# Patient Record
Sex: Female | Born: 1945
Health system: Southern US, Community
[De-identification: ages and names within clinical notes are randomized; demographics above are authoritative.]

## PROBLEM LIST (undated history)

## (undated) DIAGNOSIS — N189 Chronic kidney disease, unspecified: Secondary | ICD-10-CM

## (undated) DIAGNOSIS — J45909 Unspecified asthma, uncomplicated: Secondary | ICD-10-CM

## (undated) DIAGNOSIS — I10 Essential (primary) hypertension: Secondary | ICD-10-CM

## (undated) DIAGNOSIS — M1990 Unspecified osteoarthritis, unspecified site: Secondary | ICD-10-CM

## (undated) DIAGNOSIS — M3500 Sicca syndrome, unspecified: Secondary | ICD-10-CM

## (undated) DIAGNOSIS — R112 Nausea with vomiting, unspecified: Secondary | ICD-10-CM

## (undated) DIAGNOSIS — Z9889 Other specified postprocedural states: Secondary | ICD-10-CM

## (undated) HISTORY — PX: TUBAL LIGATION: SHX77

## (undated) HISTORY — PX: CATARACT EXTRACTION W/ INTRAOCULAR LENS IMPLANT: SHX1309

## (undated) HISTORY — PX: OTHER SURGICAL HISTORY: SHX169

## (undated) HISTORY — PX: COLONOSCOPY: SHX174

## (undated) HISTORY — PX: BREAST BIOPSY: SHX20

---

## 1981-05-27 HISTORY — PX: TUBAL LIGATION: SHX77

## 2001-05-27 HISTORY — PX: BREAST BIOPSY: SHX20

## 2007-02-27 ENCOUNTER — Encounter (HOSPITAL_COMMUNITY): Admission: RE | Admit: 2007-02-27 | Discharge: 2007-03-29 | Payer: Self-pay | Admitting: Dentistry

## 2009-01-14 IMAGING — NM NM BONE 3 PHASE
2 series · 12 of 12 positions shown · non-contrast
Comparison: None.

CLINICAL DATA: Right maxillofacial condylar resorption.

NM 3-PHASE BONE SCAN  02/27/2007:
TECHNIQUE: Radionuclide angiographic, immediate static, and 3-hour delayed
images of the head and neck were obtained after intravenous injection of
radiopharmaceutical.
Radiopharmaceutical:  25 mCi 5c-FFm MDP

[Series 1: flow and static · 9.33mm/px · 6 of 40 frames shown (1 of 2)]
[frame 4/40]
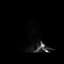
[frame 10/40]
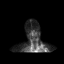
[frame 17/40]
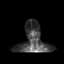
[frame 24/40]
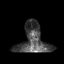
[frame 30/40]
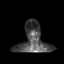
[frame 37/40]
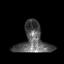

[Series 1: flow and static · 9.33mm/px · 6 of 40 frames shown (2 of 2)]
[frame 4/40]
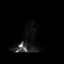
[frame 10/40]
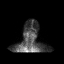
[frame 17/40]
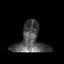
[frame 24/40]
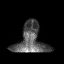
[frame 30/40]
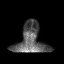
[frame 37/40]
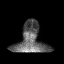

[12 of 12 positions shown; findings below may reference images not displayed]

FINDINGS: Anterior-posterior flow images demonstrate normal and symmetric flow
to both sides of the head and neck. No abnormal activity on the early static
blood pool images. Delayed static images demonstrating no abnormal uptake in the
region of the occipital or mandibular condyles. Mild degenerative uptake noted
in the cervical spine.
IMPRESSION: Mild degenerative activity in the cervical spine. Normal examination otherwise.
Specifically, no abnormal activity in the skull or face.

## 2016-06-14 DIAGNOSIS — E7800 Pure hypercholesterolemia, unspecified: Secondary | ICD-10-CM | POA: Diagnosis not present

## 2016-06-14 DIAGNOSIS — J4522 Mild intermittent asthma with status asthmaticus: Secondary | ICD-10-CM | POA: Diagnosis not present

## 2016-06-14 DIAGNOSIS — M1990 Unspecified osteoarthritis, unspecified site: Secondary | ICD-10-CM | POA: Diagnosis not present

## 2016-06-19 DIAGNOSIS — Z6825 Body mass index (BMI) 25.0-25.9, adult: Secondary | ICD-10-CM | POA: Diagnosis not present

## 2016-06-19 DIAGNOSIS — M1990 Unspecified osteoarthritis, unspecified site: Secondary | ICD-10-CM | POA: Diagnosis not present

## 2016-06-19 DIAGNOSIS — J4522 Mild intermittent asthma with status asthmaticus: Secondary | ICD-10-CM | POA: Diagnosis not present

## 2016-06-19 DIAGNOSIS — Z1389 Encounter for screening for other disorder: Secondary | ICD-10-CM | POA: Diagnosis not present

## 2016-06-19 DIAGNOSIS — E7800 Pure hypercholesterolemia, unspecified: Secondary | ICD-10-CM | POA: Diagnosis not present

## 2016-06-19 DIAGNOSIS — M7581 Other shoulder lesions, right shoulder: Secondary | ICD-10-CM | POA: Diagnosis not present

## 2016-06-19 DIAGNOSIS — Z0000 Encounter for general adult medical examination without abnormal findings: Secondary | ICD-10-CM | POA: Diagnosis not present

## 2016-07-10 DIAGNOSIS — Z1211 Encounter for screening for malignant neoplasm of colon: Secondary | ICD-10-CM | POA: Diagnosis not present

## 2016-07-10 DIAGNOSIS — Z1212 Encounter for screening for malignant neoplasm of rectum: Secondary | ICD-10-CM | POA: Diagnosis not present

## 2017-01-15 DIAGNOSIS — H04123 Dry eye syndrome of bilateral lacrimal glands: Secondary | ICD-10-CM | POA: Diagnosis not present

## 2017-01-15 DIAGNOSIS — H25813 Combined forms of age-related cataract, bilateral: Secondary | ICD-10-CM | POA: Diagnosis not present

## 2017-05-27 HISTORY — PX: CATARACT EXTRACTION W/ INTRAOCULAR LENS IMPLANT: SHX1309

## 2017-06-19 DIAGNOSIS — E7800 Pure hypercholesterolemia, unspecified: Secondary | ICD-10-CM | POA: Diagnosis not present

## 2017-06-19 DIAGNOSIS — E559 Vitamin D deficiency, unspecified: Secondary | ICD-10-CM | POA: Diagnosis not present

## 2017-06-19 DIAGNOSIS — Z6822 Body mass index (BMI) 22.0-22.9, adult: Secondary | ICD-10-CM | POA: Diagnosis not present

## 2017-06-19 DIAGNOSIS — Z0000 Encounter for general adult medical examination without abnormal findings: Secondary | ICD-10-CM | POA: Diagnosis not present

## 2017-06-19 DIAGNOSIS — D519 Vitamin B12 deficiency anemia, unspecified: Secondary | ICD-10-CM | POA: Diagnosis not present

## 2017-06-23 DIAGNOSIS — M1990 Unspecified osteoarthritis, unspecified site: Secondary | ICD-10-CM | POA: Diagnosis not present

## 2017-06-23 DIAGNOSIS — Z0000 Encounter for general adult medical examination without abnormal findings: Secondary | ICD-10-CM | POA: Diagnosis not present

## 2017-06-23 DIAGNOSIS — Z6822 Body mass index (BMI) 22.0-22.9, adult: Secondary | ICD-10-CM | POA: Diagnosis not present

## 2017-07-14 DIAGNOSIS — Z79899 Other long term (current) drug therapy: Secondary | ICD-10-CM | POA: Diagnosis not present

## 2017-07-14 DIAGNOSIS — M85852 Other specified disorders of bone density and structure, left thigh: Secondary | ICD-10-CM | POA: Diagnosis not present

## 2017-07-14 DIAGNOSIS — J45909 Unspecified asthma, uncomplicated: Secondary | ICD-10-CM | POA: Diagnosis not present

## 2017-07-14 DIAGNOSIS — M810 Age-related osteoporosis without current pathological fracture: Secondary | ICD-10-CM | POA: Diagnosis not present

## 2017-07-14 DIAGNOSIS — M85851 Other specified disorders of bone density and structure, right thigh: Secondary | ICD-10-CM | POA: Diagnosis not present

## 2017-07-14 DIAGNOSIS — Z8262 Family history of osteoporosis: Secondary | ICD-10-CM | POA: Diagnosis not present

## 2017-07-14 DIAGNOSIS — Z780 Asymptomatic menopausal state: Secondary | ICD-10-CM | POA: Diagnosis not present

## 2017-08-20 DIAGNOSIS — Z01818 Encounter for other preprocedural examination: Secondary | ICD-10-CM | POA: Diagnosis not present

## 2017-08-20 DIAGNOSIS — H35372 Puckering of macula, left eye: Secondary | ICD-10-CM | POA: Diagnosis not present

## 2017-08-20 DIAGNOSIS — H25811 Combined forms of age-related cataract, right eye: Secondary | ICD-10-CM | POA: Diagnosis not present

## 2017-09-04 DIAGNOSIS — H25811 Combined forms of age-related cataract, right eye: Secondary | ICD-10-CM | POA: Diagnosis not present

## 2017-09-04 DIAGNOSIS — H2511 Age-related nuclear cataract, right eye: Secondary | ICD-10-CM | POA: Diagnosis not present

## 2017-09-18 DIAGNOSIS — H25812 Combined forms of age-related cataract, left eye: Secondary | ICD-10-CM | POA: Diagnosis not present

## 2017-09-18 DIAGNOSIS — H2512 Age-related nuclear cataract, left eye: Secondary | ICD-10-CM | POA: Diagnosis not present

## 2017-10-15 DIAGNOSIS — H04123 Dry eye syndrome of bilateral lacrimal glands: Secondary | ICD-10-CM | POA: Diagnosis not present

## 2017-12-09 DIAGNOSIS — R21 Rash and other nonspecific skin eruption: Secondary | ICD-10-CM | POA: Diagnosis not present

## 2017-12-09 DIAGNOSIS — Z6822 Body mass index (BMI) 22.0-22.9, adult: Secondary | ICD-10-CM | POA: Diagnosis not present

## 2017-12-10 DIAGNOSIS — L570 Actinic keratosis: Secondary | ICD-10-CM | POA: Diagnosis not present

## 2017-12-10 DIAGNOSIS — L309 Dermatitis, unspecified: Secondary | ICD-10-CM | POA: Diagnosis not present

## 2017-12-10 DIAGNOSIS — D485 Neoplasm of uncertain behavior of skin: Secondary | ICD-10-CM | POA: Diagnosis not present

## 2018-02-24 DIAGNOSIS — L280 Lichen simplex chronicus: Secondary | ICD-10-CM | POA: Diagnosis not present

## 2018-02-24 DIAGNOSIS — L570 Actinic keratosis: Secondary | ICD-10-CM | POA: Diagnosis not present

## 2018-06-24 DIAGNOSIS — E559 Vitamin D deficiency, unspecified: Secondary | ICD-10-CM | POA: Diagnosis not present

## 2018-06-24 DIAGNOSIS — Z13220 Encounter for screening for lipoid disorders: Secondary | ICD-10-CM | POA: Diagnosis not present

## 2018-06-24 DIAGNOSIS — Z6822 Body mass index (BMI) 22.0-22.9, adult: Secondary | ICD-10-CM | POA: Diagnosis not present

## 2018-06-24 DIAGNOSIS — Z0000 Encounter for general adult medical examination without abnormal findings: Secondary | ICD-10-CM | POA: Diagnosis not present

## 2018-06-24 DIAGNOSIS — D519 Vitamin B12 deficiency anemia, unspecified: Secondary | ICD-10-CM | POA: Diagnosis not present

## 2018-07-01 DIAGNOSIS — M1990 Unspecified osteoarthritis, unspecified site: Secondary | ICD-10-CM | POA: Diagnosis not present

## 2018-07-01 DIAGNOSIS — Z6822 Body mass index (BMI) 22.0-22.9, adult: Secondary | ICD-10-CM | POA: Diagnosis not present

## 2018-07-01 DIAGNOSIS — J340 Abscess, furuncle and carbuncle of nose: Secondary | ICD-10-CM | POA: Diagnosis not present

## 2018-07-01 DIAGNOSIS — Z0000 Encounter for general adult medical examination without abnormal findings: Secondary | ICD-10-CM | POA: Diagnosis not present

## 2018-07-01 DIAGNOSIS — J4522 Mild intermittent asthma with status asthmaticus: Secondary | ICD-10-CM | POA: Diagnosis not present

## 2018-08-12 DIAGNOSIS — M25541 Pain in joints of right hand: Secondary | ICD-10-CM | POA: Diagnosis not present

## 2018-08-12 DIAGNOSIS — Z6821 Body mass index (BMI) 21.0-21.9, adult: Secondary | ICD-10-CM | POA: Diagnosis not present

## 2018-08-12 DIAGNOSIS — M2550 Pain in unspecified joint: Secondary | ICD-10-CM | POA: Diagnosis not present

## 2018-08-12 DIAGNOSIS — R5383 Other fatigue: Secondary | ICD-10-CM | POA: Diagnosis not present

## 2018-08-12 DIAGNOSIS — M25542 Pain in joints of left hand: Secondary | ICD-10-CM | POA: Diagnosis not present

## 2018-08-12 DIAGNOSIS — M25571 Pain in right ankle and joints of right foot: Secondary | ICD-10-CM | POA: Diagnosis not present

## 2018-08-12 DIAGNOSIS — Z7689 Persons encountering health services in other specified circumstances: Secondary | ICD-10-CM | POA: Diagnosis not present

## 2018-08-12 DIAGNOSIS — M25572 Pain in left ankle and joints of left foot: Secondary | ICD-10-CM | POA: Diagnosis not present

## 2018-08-20 DIAGNOSIS — M7989 Other specified soft tissue disorders: Secondary | ICD-10-CM | POA: Diagnosis not present

## 2018-08-20 DIAGNOSIS — R5383 Other fatigue: Secondary | ICD-10-CM | POA: Diagnosis not present

## 2018-08-20 DIAGNOSIS — R768 Other specified abnormal immunological findings in serum: Secondary | ICD-10-CM | POA: Diagnosis not present

## 2018-08-20 DIAGNOSIS — Z6822 Body mass index (BMI) 22.0-22.9, adult: Secondary | ICD-10-CM | POA: Diagnosis not present

## 2018-08-20 DIAGNOSIS — M0589 Other rheumatoid arthritis with rheumatoid factor of multiple sites: Secondary | ICD-10-CM | POA: Diagnosis not present

## 2018-08-20 DIAGNOSIS — M150 Primary generalized (osteo)arthritis: Secondary | ICD-10-CM | POA: Diagnosis not present

## 2018-09-02 DIAGNOSIS — I10 Essential (primary) hypertension: Secondary | ICD-10-CM | POA: Diagnosis not present

## 2018-09-02 DIAGNOSIS — I25118 Atherosclerotic heart disease of native coronary artery with other forms of angina pectoris: Secondary | ICD-10-CM | POA: Diagnosis not present

## 2018-09-02 DIAGNOSIS — L309 Dermatitis, unspecified: Secondary | ICD-10-CM | POA: Diagnosis not present

## 2018-09-02 DIAGNOSIS — R0602 Shortness of breath: Secondary | ICD-10-CM | POA: Diagnosis not present

## 2018-09-02 DIAGNOSIS — I739 Peripheral vascular disease, unspecified: Secondary | ICD-10-CM | POA: Diagnosis not present

## 2018-09-02 DIAGNOSIS — L570 Actinic keratosis: Secondary | ICD-10-CM | POA: Diagnosis not present

## 2018-09-02 DIAGNOSIS — H2513 Age-related nuclear cataract, bilateral: Secondary | ICD-10-CM | POA: Diagnosis not present

## 2018-09-02 DIAGNOSIS — H04123 Dry eye syndrome of bilateral lacrimal glands: Secondary | ICD-10-CM | POA: Diagnosis not present

## 2018-09-02 DIAGNOSIS — H401131 Primary open-angle glaucoma, bilateral, mild stage: Secondary | ICD-10-CM | POA: Diagnosis not present

## 2018-09-02 DIAGNOSIS — E785 Hyperlipidemia, unspecified: Secondary | ICD-10-CM | POA: Diagnosis not present

## 2018-10-21 DIAGNOSIS — R768 Other specified abnormal immunological findings in serum: Secondary | ICD-10-CM | POA: Diagnosis not present

## 2018-10-21 DIAGNOSIS — R5383 Other fatigue: Secondary | ICD-10-CM | POA: Diagnosis not present

## 2018-10-21 DIAGNOSIS — M7989 Other specified soft tissue disorders: Secondary | ICD-10-CM | POA: Diagnosis not present

## 2018-10-21 DIAGNOSIS — M791 Myalgia, unspecified site: Secondary | ICD-10-CM | POA: Diagnosis not present

## 2018-10-21 DIAGNOSIS — M150 Primary generalized (osteo)arthritis: Secondary | ICD-10-CM | POA: Diagnosis not present

## 2018-10-21 DIAGNOSIS — M3500 Sicca syndrome, unspecified: Secondary | ICD-10-CM | POA: Diagnosis not present

## 2018-10-21 DIAGNOSIS — M0589 Other rheumatoid arthritis with rheumatoid factor of multiple sites: Secondary | ICD-10-CM | POA: Diagnosis not present

## 2018-10-21 DIAGNOSIS — Z6820 Body mass index (BMI) 20.0-20.9, adult: Secondary | ICD-10-CM | POA: Diagnosis not present

## 2018-11-05 ENCOUNTER — Ambulatory Visit (INDEPENDENT_AMBULATORY_CARE_PROVIDER_SITE_OTHER): Payer: PPO | Admitting: Diagnostic Neuroimaging

## 2018-11-05 ENCOUNTER — Other Ambulatory Visit: Payer: Self-pay

## 2018-11-05 ENCOUNTER — Encounter (INDEPENDENT_AMBULATORY_CARE_PROVIDER_SITE_OTHER): Payer: PPO | Admitting: Diagnostic Neuroimaging

## 2018-11-05 DIAGNOSIS — R29898 Other symptoms and signs involving the musculoskeletal system: Secondary | ICD-10-CM

## 2018-11-05 DIAGNOSIS — Z0289 Encounter for other administrative examinations: Secondary | ICD-10-CM

## 2018-11-05 NOTE — Procedures (Signed)
[facility]  NCS (NERVE CONDUCTION STUDY) WITH EMG (ELECTROMYOGRAPHY) REPORT   STUDY DATE: 11/05/18 PATIENT NAME: Tammy Sutton DOB: [birth year] MRN: [id number]  ORDERING CLINICIAN: Marella Chimes, PA  TECHNOLOGIST: Sherre Scarlet ELECTROMYOGRAPHER: Earlean Polka. Areyana Leoni, MD  CLINICAL INFORMATION: 73 year old female with lower extremity weakness.  History of Sjogren's syndrome.  FINDINGS: NERVE CONDUCTION STUDY: Right ulnar, left peroneal and left tibial motor responses are normal.  Right peroneal and right tibial motor responses have normal distal latencies, decreased amplitudes and normal conduction velocities.  Bilateral sural and left superficial peroneal sensory responses have normal peak latencies and decreased amplitudes.  Right superficial peroneal and right ulnar sensory responses normal.  Right tibial F wave latency is slightly prolonged.  Left tibial and right ulnar F-wave latencies are normal.   NEEDLE ELECTROMYOGRAPHY:  Needle examination of right lower extremity vastus medialis, tibialis anterior, gastrocnemius, gluteus medius, iliopsoas and right lumbar paraspinal muscles is normal.   IMPRESSION:  Abnormal study demonstrating: - Axonal sensorimotor polyneuropathy.  No electrodiagnostic evidence of myopathy or polyradiculopathy at this time.    INTERPRETING PHYSICIAN:  Penni Bombard, MD Certified in Neurology, Neurophysiology and Neuroimaging  [facility] [address], [place], [place] [address] [phone]   [facility]    Nerve / Sites Muscle Latency Ref. Amplitude Ref. Rel Amp Segments Distance Velocity Ref. Area    ms ms mV mV %  cm m/s m/s mVms  R Ulnar - ADM     Wrist ADM 2.8 ?3.3 6.1 ?6.0 100 Wrist - ADM 7   21.1     B.Elbow ADM 5.6  5.3  88 B.Elbow - Wrist 17 62 ?49 18.6     A.Elbow ADM 7.3  5.2  97.5 A.Elbow - B.Elbow 10 58 ?49 18.8         A.Elbow - Wrist      R Peroneal - EDB     Ankle EDB 4.6 ?6.5  1.8 ?2.0 100 Ankle - EDB 9   6.2     Fib head EDB 10.5  1.6  86.8 Fib head - Ankle 27 45 ?44 6.0     Pop fossa EDB 12.8  1.5  93.5 Pop fossa - Fib head 10 45 ?44 6.1         Pop fossa - Ankle      L Peroneal - EDB     Ankle EDB 4.5 ?6.5 3.7 ?2.0 100 Ankle - EDB 9   11.5     Fib head EDB 10.6  3.2  88.1 Fib head - Ankle 29 48 ?44 10.9     Pop fossa EDB 12.8  3.1  97.2 Pop fossa - Fib head 10 47 ?44 10.8         Pop fossa - Ankle      R Tibial - AH     Ankle AH 3.7 ?5.8 2.5 ?4.0 100 Ankle - AH 9   5.1     Pop fossa AH 12.2  1.6  63.5 Pop fossa - Ankle 35 41 ?41 3.9  L Tibial - AH     Ankle AH 3.9 ?5.8 5.9 ?4.0 100 Ankle - AH 9   11.6     Pop fossa AH 12.7  4.4  73.9 Pop fossa - Ankle 36 41 ?41 9.7               SNC    Nerve / Sites Rec. Site Peak Lat Ref.  Amp Ref. Segments  Distance    ms ms V V  cm  R Sural - Ankle (Calf)     Calf Ankle 3.3 ?4.4 4 ?6 Calf - Ankle 14  L Sural - Ankle (Calf)     Calf Ankle 3.5 ?4.4 4 ?6 Calf - Ankle 14  R Superficial peroneal - Ankle     Lat leg Ankle 3.9 ?4.4 6 ?6 Lat leg - Ankle 14  L Superficial peroneal - Ankle     Lat leg Ankle 4.1 ?4.4 2 ?6 Lat leg - Ankle 14  R Ulnar - Orthodromic, (Dig V, Mid palm)     Dig V Wrist 3.0 ?3.1 7 ?5 Dig V - Wrist 56               F  Wave    Nerve F Lat Ref.   ms ms  R Tibial - AH 56.9 ?56.0  R Ulnar - ADM 26.3 ?32.0  L Tibial - AH 55.5 ?56.0           EMG full       EMG Summary Table    Spontaneous MUAP Recruitment  Muscle IA Fib PSW Fasc Other Amp Dur. Poly Pattern  R. Vastus medialis Normal None None None _______ Normal Normal Normal Normal  R. Tibialis anterior Normal None None None _______ Normal Normal Normal Normal  R. Gastrocnemius (Medial head) Normal None None None _______ Normal Normal Normal Normal  R. Iliopsoas Normal None None None _______ Normal Normal Normal Normal  R. Gluteus medius Normal None None None _______ Normal Normal Normal Normal  R. Lumbar paraspinals Normal None None None  _______ Normal Normal Normal Normal

## 2018-12-10 DIAGNOSIS — Z681 Body mass index (BMI) 19 or less, adult: Secondary | ICD-10-CM | POA: Diagnosis not present

## 2018-12-10 DIAGNOSIS — R768 Other specified abnormal immunological findings in serum: Secondary | ICD-10-CM | POA: Diagnosis not present

## 2018-12-10 DIAGNOSIS — M7989 Other specified soft tissue disorders: Secondary | ICD-10-CM | POA: Diagnosis not present

## 2018-12-10 DIAGNOSIS — M791 Myalgia, unspecified site: Secondary | ICD-10-CM | POA: Diagnosis not present

## 2018-12-10 DIAGNOSIS — M0589 Other rheumatoid arthritis with rheumatoid factor of multiple sites: Secondary | ICD-10-CM | POA: Diagnosis not present

## 2018-12-10 DIAGNOSIS — R5383 Other fatigue: Secondary | ICD-10-CM | POA: Diagnosis not present

## 2018-12-10 DIAGNOSIS — M150 Primary generalized (osteo)arthritis: Secondary | ICD-10-CM | POA: Diagnosis not present

## 2018-12-10 DIAGNOSIS — M3500 Sicca syndrome, unspecified: Secondary | ICD-10-CM | POA: Diagnosis not present

## 2018-12-29 DIAGNOSIS — M0589 Other rheumatoid arthritis with rheumatoid factor of multiple sites: Secondary | ICD-10-CM | POA: Diagnosis not present

## 2019-01-27 DIAGNOSIS — M0589 Other rheumatoid arthritis with rheumatoid factor of multiple sites: Secondary | ICD-10-CM | POA: Diagnosis not present

## 2019-01-27 DIAGNOSIS — H04123 Dry eye syndrome of bilateral lacrimal glands: Secondary | ICD-10-CM | POA: Diagnosis not present

## 2019-03-18 DIAGNOSIS — R5383 Other fatigue: Secondary | ICD-10-CM | POA: Diagnosis not present

## 2019-03-18 DIAGNOSIS — M7989 Other specified soft tissue disorders: Secondary | ICD-10-CM | POA: Diagnosis not present

## 2019-03-18 DIAGNOSIS — M0589 Other rheumatoid arthritis with rheumatoid factor of multiple sites: Secondary | ICD-10-CM | POA: Diagnosis not present

## 2019-03-18 DIAGNOSIS — Z681 Body mass index (BMI) 19 or less, adult: Secondary | ICD-10-CM | POA: Diagnosis not present

## 2019-03-18 DIAGNOSIS — R768 Other specified abnormal immunological findings in serum: Secondary | ICD-10-CM | POA: Diagnosis not present

## 2019-03-18 DIAGNOSIS — M150 Primary generalized (osteo)arthritis: Secondary | ICD-10-CM | POA: Diagnosis not present

## 2019-03-18 DIAGNOSIS — G608 Other hereditary and idiopathic neuropathies: Secondary | ICD-10-CM | POA: Diagnosis not present

## 2019-03-18 DIAGNOSIS — M3500 Sicca syndrome, unspecified: Secondary | ICD-10-CM | POA: Diagnosis not present

## 2019-03-18 DIAGNOSIS — M791 Myalgia, unspecified site: Secondary | ICD-10-CM | POA: Diagnosis not present

## 2019-03-26 DIAGNOSIS — M0589 Other rheumatoid arthritis with rheumatoid factor of multiple sites: Secondary | ICD-10-CM | POA: Diagnosis not present

## 2019-05-03 DIAGNOSIS — Z1231 Encounter for screening mammogram for malignant neoplasm of breast: Secondary | ICD-10-CM | POA: Diagnosis not present

## 2019-06-16 DIAGNOSIS — R768 Other specified abnormal immunological findings in serum: Secondary | ICD-10-CM | POA: Diagnosis not present

## 2019-06-16 DIAGNOSIS — R5383 Other fatigue: Secondary | ICD-10-CM | POA: Diagnosis not present

## 2019-06-16 DIAGNOSIS — M0589 Other rheumatoid arthritis with rheumatoid factor of multiple sites: Secondary | ICD-10-CM | POA: Diagnosis not present

## 2019-07-02 DIAGNOSIS — E559 Vitamin D deficiency, unspecified: Secondary | ICD-10-CM | POA: Diagnosis not present

## 2019-07-02 DIAGNOSIS — D519 Vitamin B12 deficiency anemia, unspecified: Secondary | ICD-10-CM | POA: Diagnosis not present

## 2019-07-02 DIAGNOSIS — Z13220 Encounter for screening for lipoid disorders: Secondary | ICD-10-CM | POA: Diagnosis not present

## 2019-07-06 DIAGNOSIS — M1990 Unspecified osteoarthritis, unspecified site: Secondary | ICD-10-CM | POA: Diagnosis not present

## 2019-07-06 DIAGNOSIS — J4522 Mild intermittent asthma with status asthmaticus: Secondary | ICD-10-CM | POA: Diagnosis not present

## 2019-07-06 DIAGNOSIS — Z681 Body mass index (BMI) 19 or less, adult: Secondary | ICD-10-CM | POA: Diagnosis not present

## 2019-07-06 DIAGNOSIS — Z0000 Encounter for general adult medical examination without abnormal findings: Secondary | ICD-10-CM | POA: Diagnosis not present

## 2019-07-14 DIAGNOSIS — Z8249 Family history of ischemic heart disease and other diseases of the circulatory system: Secondary | ICD-10-CM | POA: Diagnosis not present

## 2019-07-14 DIAGNOSIS — Z681 Body mass index (BMI) 19 or less, adult: Secondary | ICD-10-CM | POA: Diagnosis not present

## 2019-07-14 DIAGNOSIS — Z0000 Encounter for general adult medical examination without abnormal findings: Secondary | ICD-10-CM | POA: Diagnosis not present

## 2019-08-04 DIAGNOSIS — M150 Primary generalized (osteo)arthritis: Secondary | ICD-10-CM | POA: Diagnosis not present

## 2019-08-04 DIAGNOSIS — R5383 Other fatigue: Secondary | ICD-10-CM | POA: Diagnosis not present

## 2019-08-04 DIAGNOSIS — R768 Other specified abnormal immunological findings in serum: Secondary | ICD-10-CM | POA: Diagnosis not present

## 2019-08-04 DIAGNOSIS — M791 Myalgia, unspecified site: Secondary | ICD-10-CM | POA: Diagnosis not present

## 2019-08-04 DIAGNOSIS — M3500 Sicca syndrome, unspecified: Secondary | ICD-10-CM | POA: Diagnosis not present

## 2019-08-04 DIAGNOSIS — M7989 Other specified soft tissue disorders: Secondary | ICD-10-CM | POA: Diagnosis not present

## 2019-08-04 DIAGNOSIS — G608 Other hereditary and idiopathic neuropathies: Secondary | ICD-10-CM | POA: Diagnosis not present

## 2019-08-04 DIAGNOSIS — M0589 Other rheumatoid arthritis with rheumatoid factor of multiple sites: Secondary | ICD-10-CM | POA: Diagnosis not present

## 2019-09-01 DIAGNOSIS — I781 Nevus, non-neoplastic: Secondary | ICD-10-CM | POA: Diagnosis not present

## 2019-09-01 DIAGNOSIS — L821 Other seborrheic keratosis: Secondary | ICD-10-CM | POA: Diagnosis not present

## 2019-09-01 DIAGNOSIS — L570 Actinic keratosis: Secondary | ICD-10-CM | POA: Diagnosis not present

## 2019-09-01 DIAGNOSIS — D1801 Hemangioma of skin and subcutaneous tissue: Secondary | ICD-10-CM | POA: Diagnosis not present

## 2019-09-27 DIAGNOSIS — S86819A Strain of other muscle(s) and tendon(s) at lower leg level, unspecified leg, initial encounter: Secondary | ICD-10-CM | POA: Diagnosis not present

## 2019-09-30 DIAGNOSIS — Z0389 Encounter for observation for other suspected diseases and conditions ruled out: Secondary | ICD-10-CM | POA: Diagnosis not present

## 2019-09-30 DIAGNOSIS — M810 Age-related osteoporosis without current pathological fracture: Secondary | ICD-10-CM | POA: Diagnosis not present

## 2019-10-04 DIAGNOSIS — S86819A Strain of other muscle(s) and tendon(s) at lower leg level, unspecified leg, initial encounter: Secondary | ICD-10-CM | POA: Diagnosis not present

## 2019-10-04 DIAGNOSIS — M1711 Unilateral primary osteoarthritis, right knee: Secondary | ICD-10-CM | POA: Diagnosis not present

## 2019-10-04 DIAGNOSIS — M7120 Synovial cyst of popliteal space [Baker], unspecified knee: Secondary | ICD-10-CM | POA: Diagnosis not present

## 2019-12-18 DIAGNOSIS — R238 Other skin changes: Secondary | ICD-10-CM | POA: Diagnosis not present

## 2019-12-18 DIAGNOSIS — L709 Acne, unspecified: Secondary | ICD-10-CM | POA: Diagnosis not present

## 2019-12-18 DIAGNOSIS — L03211 Cellulitis of face: Secondary | ICD-10-CM | POA: Diagnosis not present

## 2020-01-11 DIAGNOSIS — M9901 Segmental and somatic dysfunction of cervical region: Secondary | ICD-10-CM | POA: Diagnosis not present

## 2020-01-11 DIAGNOSIS — S335XXA Sprain of ligaments of lumbar spine, initial encounter: Secondary | ICD-10-CM | POA: Diagnosis not present

## 2020-01-11 DIAGNOSIS — M9903 Segmental and somatic dysfunction of lumbar region: Secondary | ICD-10-CM | POA: Diagnosis not present

## 2020-01-13 DIAGNOSIS — M9903 Segmental and somatic dysfunction of lumbar region: Secondary | ICD-10-CM | POA: Diagnosis not present

## 2020-01-13 DIAGNOSIS — M9901 Segmental and somatic dysfunction of cervical region: Secondary | ICD-10-CM | POA: Diagnosis not present

## 2020-01-13 DIAGNOSIS — S335XXA Sprain of ligaments of lumbar spine, initial encounter: Secondary | ICD-10-CM | POA: Diagnosis not present

## 2020-01-18 DIAGNOSIS — M9901 Segmental and somatic dysfunction of cervical region: Secondary | ICD-10-CM | POA: Diagnosis not present

## 2020-01-18 DIAGNOSIS — S335XXA Sprain of ligaments of lumbar spine, initial encounter: Secondary | ICD-10-CM | POA: Diagnosis not present

## 2020-01-18 DIAGNOSIS — M9903 Segmental and somatic dysfunction of lumbar region: Secondary | ICD-10-CM | POA: Diagnosis not present

## 2020-01-24 DIAGNOSIS — M9903 Segmental and somatic dysfunction of lumbar region: Secondary | ICD-10-CM | POA: Diagnosis not present

## 2020-01-24 DIAGNOSIS — M9901 Segmental and somatic dysfunction of cervical region: Secondary | ICD-10-CM | POA: Diagnosis not present

## 2020-01-24 DIAGNOSIS — S335XXA Sprain of ligaments of lumbar spine, initial encounter: Secondary | ICD-10-CM | POA: Diagnosis not present

## 2020-02-02 DIAGNOSIS — H04123 Dry eye syndrome of bilateral lacrimal glands: Secondary | ICD-10-CM | POA: Diagnosis not present

## 2020-02-03 DIAGNOSIS — M9903 Segmental and somatic dysfunction of lumbar region: Secondary | ICD-10-CM | POA: Diagnosis not present

## 2020-02-03 DIAGNOSIS — S335XXA Sprain of ligaments of lumbar spine, initial encounter: Secondary | ICD-10-CM | POA: Diagnosis not present

## 2020-02-03 DIAGNOSIS — M9901 Segmental and somatic dysfunction of cervical region: Secondary | ICD-10-CM | POA: Diagnosis not present

## 2020-02-10 DIAGNOSIS — M9903 Segmental and somatic dysfunction of lumbar region: Secondary | ICD-10-CM | POA: Diagnosis not present

## 2020-02-10 DIAGNOSIS — S335XXA Sprain of ligaments of lumbar spine, initial encounter: Secondary | ICD-10-CM | POA: Diagnosis not present

## 2020-02-10 DIAGNOSIS — M9901 Segmental and somatic dysfunction of cervical region: Secondary | ICD-10-CM | POA: Diagnosis not present

## 2020-02-16 DIAGNOSIS — M0589 Other rheumatoid arthritis with rheumatoid factor of multiple sites: Secondary | ICD-10-CM | POA: Diagnosis not present

## 2020-02-16 DIAGNOSIS — R748 Abnormal levels of other serum enzymes: Secondary | ICD-10-CM | POA: Diagnosis not present

## 2020-02-16 DIAGNOSIS — M150 Primary generalized (osteo)arthritis: Secondary | ICD-10-CM | POA: Diagnosis not present

## 2020-02-16 DIAGNOSIS — G608 Other hereditary and idiopathic neuropathies: Secondary | ICD-10-CM | POA: Diagnosis not present

## 2020-02-16 DIAGNOSIS — M3500 Sicca syndrome, unspecified: Secondary | ICD-10-CM | POA: Diagnosis not present

## 2020-02-16 DIAGNOSIS — Z681 Body mass index (BMI) 19 or less, adult: Secondary | ICD-10-CM | POA: Diagnosis not present

## 2020-02-17 DIAGNOSIS — S335XXA Sprain of ligaments of lumbar spine, initial encounter: Secondary | ICD-10-CM | POA: Diagnosis not present

## 2020-02-17 DIAGNOSIS — M9901 Segmental and somatic dysfunction of cervical region: Secondary | ICD-10-CM | POA: Diagnosis not present

## 2020-02-17 DIAGNOSIS — M9903 Segmental and somatic dysfunction of lumbar region: Secondary | ICD-10-CM | POA: Diagnosis not present

## 2020-03-01 DIAGNOSIS — M9903 Segmental and somatic dysfunction of lumbar region: Secondary | ICD-10-CM | POA: Diagnosis not present

## 2020-03-01 DIAGNOSIS — S335XXA Sprain of ligaments of lumbar spine, initial encounter: Secondary | ICD-10-CM | POA: Diagnosis not present

## 2020-03-01 DIAGNOSIS — M9901 Segmental and somatic dysfunction of cervical region: Secondary | ICD-10-CM | POA: Diagnosis not present

## 2020-03-15 DIAGNOSIS — S335XXA Sprain of ligaments of lumbar spine, initial encounter: Secondary | ICD-10-CM | POA: Diagnosis not present

## 2020-03-15 DIAGNOSIS — M9903 Segmental and somatic dysfunction of lumbar region: Secondary | ICD-10-CM | POA: Diagnosis not present

## 2020-03-15 DIAGNOSIS — M9901 Segmental and somatic dysfunction of cervical region: Secondary | ICD-10-CM | POA: Diagnosis not present

## 2020-03-29 DIAGNOSIS — M9901 Segmental and somatic dysfunction of cervical region: Secondary | ICD-10-CM | POA: Diagnosis not present

## 2020-03-29 DIAGNOSIS — S335XXA Sprain of ligaments of lumbar spine, initial encounter: Secondary | ICD-10-CM | POA: Diagnosis not present

## 2020-03-29 DIAGNOSIS — M9903 Segmental and somatic dysfunction of lumbar region: Secondary | ICD-10-CM | POA: Diagnosis not present

## 2020-04-12 DIAGNOSIS — M9901 Segmental and somatic dysfunction of cervical region: Secondary | ICD-10-CM | POA: Diagnosis not present

## 2020-04-12 DIAGNOSIS — M9903 Segmental and somatic dysfunction of lumbar region: Secondary | ICD-10-CM | POA: Diagnosis not present

## 2020-04-12 DIAGNOSIS — S335XXA Sprain of ligaments of lumbar spine, initial encounter: Secondary | ICD-10-CM | POA: Diagnosis not present

## 2020-04-26 DIAGNOSIS — M9901 Segmental and somatic dysfunction of cervical region: Secondary | ICD-10-CM | POA: Diagnosis not present

## 2020-04-26 DIAGNOSIS — M9903 Segmental and somatic dysfunction of lumbar region: Secondary | ICD-10-CM | POA: Diagnosis not present

## 2020-04-26 DIAGNOSIS — S335XXA Sprain of ligaments of lumbar spine, initial encounter: Secondary | ICD-10-CM | POA: Diagnosis not present

## 2020-05-10 DIAGNOSIS — S335XXA Sprain of ligaments of lumbar spine, initial encounter: Secondary | ICD-10-CM | POA: Diagnosis not present

## 2020-05-10 DIAGNOSIS — M9901 Segmental and somatic dysfunction of cervical region: Secondary | ICD-10-CM | POA: Diagnosis not present

## 2020-05-10 DIAGNOSIS — M9903 Segmental and somatic dysfunction of lumbar region: Secondary | ICD-10-CM | POA: Diagnosis not present

## 2020-05-24 DIAGNOSIS — M9903 Segmental and somatic dysfunction of lumbar region: Secondary | ICD-10-CM | POA: Diagnosis not present

## 2020-05-24 DIAGNOSIS — S335XXA Sprain of ligaments of lumbar spine, initial encounter: Secondary | ICD-10-CM | POA: Diagnosis not present

## 2020-05-24 DIAGNOSIS — M9901 Segmental and somatic dysfunction of cervical region: Secondary | ICD-10-CM | POA: Diagnosis not present

## 2020-06-07 DIAGNOSIS — S335XXA Sprain of ligaments of lumbar spine, initial encounter: Secondary | ICD-10-CM | POA: Diagnosis not present

## 2020-06-07 DIAGNOSIS — M9903 Segmental and somatic dysfunction of lumbar region: Secondary | ICD-10-CM | POA: Diagnosis not present

## 2020-06-07 DIAGNOSIS — M9901 Segmental and somatic dysfunction of cervical region: Secondary | ICD-10-CM | POA: Diagnosis not present

## 2020-06-21 DIAGNOSIS — M9901 Segmental and somatic dysfunction of cervical region: Secondary | ICD-10-CM | POA: Diagnosis not present

## 2020-06-21 DIAGNOSIS — M9903 Segmental and somatic dysfunction of lumbar region: Secondary | ICD-10-CM | POA: Diagnosis not present

## 2020-06-21 DIAGNOSIS — S335XXA Sprain of ligaments of lumbar spine, initial encounter: Secondary | ICD-10-CM | POA: Diagnosis not present

## 2020-07-05 DIAGNOSIS — M9903 Segmental and somatic dysfunction of lumbar region: Secondary | ICD-10-CM | POA: Diagnosis not present

## 2020-07-05 DIAGNOSIS — S335XXA Sprain of ligaments of lumbar spine, initial encounter: Secondary | ICD-10-CM | POA: Diagnosis not present

## 2020-07-05 DIAGNOSIS — M9901 Segmental and somatic dysfunction of cervical region: Secondary | ICD-10-CM | POA: Diagnosis not present

## 2020-07-07 DIAGNOSIS — E7800 Pure hypercholesterolemia, unspecified: Secondary | ICD-10-CM | POA: Diagnosis not present

## 2020-07-07 DIAGNOSIS — E7801 Familial hypercholesterolemia: Secondary | ICD-10-CM | POA: Diagnosis not present

## 2020-07-07 DIAGNOSIS — D519 Vitamin B12 deficiency anemia, unspecified: Secondary | ICD-10-CM | POA: Diagnosis not present

## 2020-07-07 DIAGNOSIS — E559 Vitamin D deficiency, unspecified: Secondary | ICD-10-CM | POA: Diagnosis not present

## 2020-07-07 DIAGNOSIS — Z13220 Encounter for screening for lipoid disorders: Secondary | ICD-10-CM | POA: Diagnosis not present

## 2020-07-12 DIAGNOSIS — D72819 Decreased white blood cell count, unspecified: Secondary | ICD-10-CM | POA: Diagnosis not present

## 2020-07-12 DIAGNOSIS — S46001A Unspecified injury of muscle(s) and tendon(s) of the rotator cuff of right shoulder, initial encounter: Secondary | ICD-10-CM | POA: Diagnosis not present

## 2020-07-12 DIAGNOSIS — M8580 Other specified disorders of bone density and structure, unspecified site: Secondary | ICD-10-CM | POA: Diagnosis not present

## 2020-07-12 DIAGNOSIS — Z6820 Body mass index (BMI) 20.0-20.9, adult: Secondary | ICD-10-CM | POA: Diagnosis not present

## 2020-07-12 DIAGNOSIS — R7303 Prediabetes: Secondary | ICD-10-CM | POA: Diagnosis not present

## 2020-07-18 DIAGNOSIS — M05761 Rheumatoid arthritis with rheumatoid factor of right knee without organ or systems involvement: Secondary | ICD-10-CM | POA: Diagnosis not present

## 2020-07-18 DIAGNOSIS — M1711 Unilateral primary osteoarthritis, right knee: Secondary | ICD-10-CM | POA: Diagnosis not present

## 2020-07-18 DIAGNOSIS — S46011A Strain of muscle(s) and tendon(s) of the rotator cuff of right shoulder, initial encounter: Secondary | ICD-10-CM | POA: Diagnosis not present

## 2020-07-18 DIAGNOSIS — M7061 Trochanteric bursitis, right hip: Secondary | ICD-10-CM | POA: Diagnosis not present

## 2020-07-18 DIAGNOSIS — M7121 Synovial cyst of popliteal space [Baker], right knee: Secondary | ICD-10-CM | POA: Diagnosis not present

## 2020-07-19 DIAGNOSIS — S335XXA Sprain of ligaments of lumbar spine, initial encounter: Secondary | ICD-10-CM | POA: Diagnosis not present

## 2020-07-19 DIAGNOSIS — M9903 Segmental and somatic dysfunction of lumbar region: Secondary | ICD-10-CM | POA: Diagnosis not present

## 2020-07-19 DIAGNOSIS — M9901 Segmental and somatic dysfunction of cervical region: Secondary | ICD-10-CM | POA: Diagnosis not present

## 2020-07-21 DIAGNOSIS — M8580 Other specified disorders of bone density and structure, unspecified site: Secondary | ICD-10-CM | POA: Diagnosis not present

## 2020-07-21 DIAGNOSIS — R7303 Prediabetes: Secondary | ICD-10-CM | POA: Diagnosis not present

## 2020-07-21 DIAGNOSIS — I10 Essential (primary) hypertension: Secondary | ICD-10-CM | POA: Diagnosis not present

## 2020-07-21 DIAGNOSIS — Z6820 Body mass index (BMI) 20.0-20.9, adult: Secondary | ICD-10-CM | POA: Diagnosis not present

## 2020-07-21 DIAGNOSIS — Z1389 Encounter for screening for other disorder: Secondary | ICD-10-CM | POA: Diagnosis not present

## 2020-07-21 DIAGNOSIS — D72819 Decreased white blood cell count, unspecified: Secondary | ICD-10-CM | POA: Diagnosis not present

## 2020-07-21 DIAGNOSIS — S46001A Unspecified injury of muscle(s) and tendon(s) of the rotator cuff of right shoulder, initial encounter: Secondary | ICD-10-CM | POA: Diagnosis not present

## 2020-07-21 DIAGNOSIS — Z1331 Encounter for screening for depression: Secondary | ICD-10-CM | POA: Diagnosis not present

## 2020-07-24 DIAGNOSIS — K7689 Other specified diseases of liver: Secondary | ICD-10-CM | POA: Diagnosis not present

## 2020-07-24 DIAGNOSIS — R7989 Other specified abnormal findings of blood chemistry: Secondary | ICD-10-CM | POA: Diagnosis not present

## 2020-07-28 DIAGNOSIS — M7121 Synovial cyst of popliteal space [Baker], right knee: Secondary | ICD-10-CM | POA: Diagnosis not present

## 2020-08-09 DIAGNOSIS — D72819 Decreased white blood cell count, unspecified: Secondary | ICD-10-CM | POA: Diagnosis not present

## 2020-08-09 DIAGNOSIS — M9901 Segmental and somatic dysfunction of cervical region: Secondary | ICD-10-CM | POA: Diagnosis not present

## 2020-08-09 DIAGNOSIS — E7800 Pure hypercholesterolemia, unspecified: Secondary | ICD-10-CM | POA: Diagnosis not present

## 2020-08-09 DIAGNOSIS — E7801 Familial hypercholesterolemia: Secondary | ICD-10-CM | POA: Diagnosis not present

## 2020-08-09 DIAGNOSIS — M9903 Segmental and somatic dysfunction of lumbar region: Secondary | ICD-10-CM | POA: Diagnosis not present

## 2020-08-09 DIAGNOSIS — M2550 Pain in unspecified joint: Secondary | ICD-10-CM | POA: Diagnosis not present

## 2020-08-09 DIAGNOSIS — S335XXA Sprain of ligaments of lumbar spine, initial encounter: Secondary | ICD-10-CM | POA: Diagnosis not present

## 2020-08-09 DIAGNOSIS — M1990 Unspecified osteoarthritis, unspecified site: Secondary | ICD-10-CM | POA: Diagnosis not present

## 2020-08-16 DIAGNOSIS — M3500 Sicca syndrome, unspecified: Secondary | ICD-10-CM | POA: Diagnosis not present

## 2020-08-16 DIAGNOSIS — M150 Primary generalized (osteo)arthritis: Secondary | ICD-10-CM | POA: Diagnosis not present

## 2020-08-16 DIAGNOSIS — G608 Other hereditary and idiopathic neuropathies: Secondary | ICD-10-CM | POA: Diagnosis not present

## 2020-08-16 DIAGNOSIS — M0589 Other rheumatoid arthritis with rheumatoid factor of multiple sites: Secondary | ICD-10-CM | POA: Diagnosis not present

## 2020-08-16 DIAGNOSIS — Z681 Body mass index (BMI) 19 or less, adult: Secondary | ICD-10-CM | POA: Diagnosis not present

## 2020-08-16 DIAGNOSIS — R748 Abnormal levels of other serum enzymes: Secondary | ICD-10-CM | POA: Diagnosis not present

## 2020-08-30 DIAGNOSIS — M9903 Segmental and somatic dysfunction of lumbar region: Secondary | ICD-10-CM | POA: Diagnosis not present

## 2020-08-30 DIAGNOSIS — M9901 Segmental and somatic dysfunction of cervical region: Secondary | ICD-10-CM | POA: Diagnosis not present

## 2020-08-30 DIAGNOSIS — S335XXA Sprain of ligaments of lumbar spine, initial encounter: Secondary | ICD-10-CM | POA: Diagnosis not present

## 2020-09-06 DIAGNOSIS — D485 Neoplasm of uncertain behavior of skin: Secondary | ICD-10-CM | POA: Diagnosis not present

## 2020-09-06 DIAGNOSIS — D239 Other benign neoplasm of skin, unspecified: Secondary | ICD-10-CM | POA: Diagnosis not present

## 2020-09-06 DIAGNOSIS — D2262 Melanocytic nevi of left upper limb, including shoulder: Secondary | ICD-10-CM | POA: Diagnosis not present

## 2020-09-06 DIAGNOSIS — Z1283 Encounter for screening for malignant neoplasm of skin: Secondary | ICD-10-CM | POA: Diagnosis not present

## 2020-09-06 DIAGNOSIS — L570 Actinic keratosis: Secondary | ICD-10-CM | POA: Diagnosis not present

## 2020-09-07 ENCOUNTER — Encounter: Payer: PPO | Attending: Internal Medicine | Admitting: Nutrition

## 2020-09-07 ENCOUNTER — Other Ambulatory Visit: Payer: Self-pay

## 2020-09-07 VITALS — Ht 66.0 in | Wt 119.0 lb

## 2020-09-07 DIAGNOSIS — R7301 Impaired fasting glucose: Secondary | ICD-10-CM | POA: Insufficient documentation

## 2020-09-07 DIAGNOSIS — M05731 Rheumatoid arthritis with rheumatoid factor of right wrist without organ or systems involvement: Secondary | ICD-10-CM | POA: Insufficient documentation

## 2020-09-07 DIAGNOSIS — M05732 Rheumatoid arthritis with rheumatoid factor of left wrist without organ or systems involvement: Secondary | ICD-10-CM | POA: Insufficient documentation

## 2020-09-07 NOTE — Progress Notes (Signed)
Medical Nutrition Therapy  Appointment Start time:  1300  Appointment End time:  1400  Primary concerns today:  Pre DM , CKD Referral diagnosis: A1c 6.1%, R73.0 Preferred learning style:  Read Learning readiness:  change in progress   NUTRITION ASSESSMENT   Had cortisone shot for her back recently and feels that is causing her elevated blood sugar. Rheumatoid Arthritis; Sjogren's Syndrome,  Glucose elevation is most likely related to long term steroid use and inconsistent and insuffient calorie intake. Current diet is insuffient to meet her calorie and nutrient needs. She is very thin with a BMI of 19. She notes she hasn't ever weighed much; the most was about 140 lbs..  Very little body fat and has bony prominents. She denies being hungry but is very health conscious and tries to avoid foods that cause inflammation. Doesn't want to take medications. Hasn't felt any different if she eats foods known to cause inflammation but she restricts a lot of foods and therefore not getting enough calories, protein, CHO and healthy fats to provided needed weight gain.  Anthropometrics  Wt Readings from Last 3 Encounters:  09/07/20 119 lb (54 kg)   Ht Readings from Last 3 Encounters:  09/07/20 5\' 6"  (1.676 m)   Body mass index is 19.21 kg/m. @BMIFA @ Facility age limit for growth percentiles is 20 years. Facility age limit for growth percentiles is 20 years.   Clinical Medical Hx: Asthma, Sjgren's syndrome, elevated glucose Medications: see chart Labs:  Notable Signs/Symptoms: Denies any symptoms of elevated blood sugars. Feels her S is related to all her steriods that she has taken over the years for her RA/sjogren's syndrome.  Lifestyle & Dietary Hx Lives by herself. She is active and physically. Goes to [employer] and does manual yard work.  Estimated daily fluid intake: 48oz Supplements: none Sleep: fairly well  Stress / self-care: her on going pain  Current average weekly physical  activity: active in yard work and house work.  24-Hr Dietary Recall First Meal: avocado 1 small,  Snack:  Second Meal: nuts, popcorn, salad in the bag, asain sesame ginger Snack:  Third Meal: [employer], Curry with vegetables.  Snack:  Beverages: Coffee, water-  Estimated Energy Needs Calories: 1800 -2000 Carbohydrate: 200g Protein: 135g Fat: 50g   NUTRITION DIAGNOSIS  NB-1.1 Food and nutrition-related knowledge deficit As related to Pre  Diabetes Type 2.  As evidenced by  A1C 6.1%.   NUTRITION INTERVENTION  Nutrition education (E-1) on the following topics:  . Nutrition and  Pre- Diabetes education provided on My Plate, CHO counting, meal planning, portion sizes, timing of meals, avoiding snacks between meals unless having a low blood sugar, target ranges for A1C and blood sugars, signs/symptoms and treatment of hyper/hypoglycemia, monitoring blood sugars, taking medications as prescribed, benefits of exercising 30 minutes per day and prevention of DM. Marland Kitchen Plant based diet and avoiding processed foods Handouts Provided Include   My Plate  Meal Card Plan Prediabetes Learning Style & Readiness for Change Teaching method utilized: Visual & Auditory  Demonstrated degree of understanding via: Teach Back  Barriers to learning/adherence to lifestyle change: none  Goals Established by Pt . Eat meals on time . Increase plant based foods . Avoid simple sugars and processed foods . Drink only water . Exercise 30-60 minutes a day . Get A1C back down to below 5.7% .    MONITORING & EVALUATION Dietary intake, weekly physical activity,  in a month.  Next Steps  Patient is to work on  better balanced meals.Marland Kitchen

## 2020-09-19 DIAGNOSIS — M9903 Segmental and somatic dysfunction of lumbar region: Secondary | ICD-10-CM | POA: Diagnosis not present

## 2020-09-19 DIAGNOSIS — M9901 Segmental and somatic dysfunction of cervical region: Secondary | ICD-10-CM | POA: Diagnosis not present

## 2020-09-19 DIAGNOSIS — S335XXA Sprain of ligaments of lumbar spine, initial encounter: Secondary | ICD-10-CM | POA: Diagnosis not present

## 2020-09-26 ENCOUNTER — Encounter: Payer: Self-pay | Admitting: Nutrition

## 2020-09-26 NOTE — Patient Instructions (Addendum)
  Goals Established by Pt . Eat meals on time . Increase plant based foods . Avoid simple sugars and processed foods . Drink only water . Exercise 30-60 minutes a day . Get A1C back down to below 5.7% . Avoid inflammatory foods.

## 2020-09-28 DIAGNOSIS — M1711 Unilateral primary osteoarthritis, right knee: Secondary | ICD-10-CM | POA: Diagnosis not present

## 2020-10-11 DIAGNOSIS — M9903 Segmental and somatic dysfunction of lumbar region: Secondary | ICD-10-CM | POA: Diagnosis not present

## 2020-10-11 DIAGNOSIS — S335XXA Sprain of ligaments of lumbar spine, initial encounter: Secondary | ICD-10-CM | POA: Diagnosis not present

## 2020-10-11 DIAGNOSIS — M9901 Segmental and somatic dysfunction of cervical region: Secondary | ICD-10-CM | POA: Diagnosis not present

## 2020-10-12 DIAGNOSIS — D72819 Decreased white blood cell count, unspecified: Secondary | ICD-10-CM | POA: Diagnosis not present

## 2020-10-12 DIAGNOSIS — S46001A Unspecified injury of muscle(s) and tendon(s) of the rotator cuff of right shoulder, initial encounter: Secondary | ICD-10-CM | POA: Diagnosis not present

## 2020-10-12 DIAGNOSIS — M8580 Other specified disorders of bone density and structure, unspecified site: Secondary | ICD-10-CM | POA: Diagnosis not present

## 2020-10-12 DIAGNOSIS — Z23 Encounter for immunization: Secondary | ICD-10-CM | POA: Diagnosis not present

## 2020-10-12 DIAGNOSIS — R7303 Prediabetes: Secondary | ICD-10-CM | POA: Diagnosis not present

## 2020-10-12 DIAGNOSIS — I10 Essential (primary) hypertension: Secondary | ICD-10-CM | POA: Diagnosis not present

## 2020-10-12 DIAGNOSIS — Z0001 Encounter for general adult medical examination with abnormal findings: Secondary | ICD-10-CM | POA: Diagnosis not present

## 2020-10-12 DIAGNOSIS — Z6820 Body mass index (BMI) 20.0-20.9, adult: Secondary | ICD-10-CM | POA: Diagnosis not present

## 2020-10-12 DIAGNOSIS — Z681 Body mass index (BMI) 19 or less, adult: Secondary | ICD-10-CM | POA: Diagnosis not present

## 2020-11-01 DIAGNOSIS — S335XXA Sprain of ligaments of lumbar spine, initial encounter: Secondary | ICD-10-CM | POA: Diagnosis not present

## 2020-11-01 DIAGNOSIS — M9901 Segmental and somatic dysfunction of cervical region: Secondary | ICD-10-CM | POA: Diagnosis not present

## 2020-11-01 DIAGNOSIS — M9903 Segmental and somatic dysfunction of lumbar region: Secondary | ICD-10-CM | POA: Diagnosis not present

## 2020-11-22 DIAGNOSIS — S335XXA Sprain of ligaments of lumbar spine, initial encounter: Secondary | ICD-10-CM | POA: Diagnosis not present

## 2020-11-22 DIAGNOSIS — M9903 Segmental and somatic dysfunction of lumbar region: Secondary | ICD-10-CM | POA: Diagnosis not present

## 2020-11-22 DIAGNOSIS — M9901 Segmental and somatic dysfunction of cervical region: Secondary | ICD-10-CM | POA: Diagnosis not present

## 2020-12-13 DIAGNOSIS — M9903 Segmental and somatic dysfunction of lumbar region: Secondary | ICD-10-CM | POA: Diagnosis not present

## 2020-12-13 DIAGNOSIS — M9901 Segmental and somatic dysfunction of cervical region: Secondary | ICD-10-CM | POA: Diagnosis not present

## 2020-12-13 DIAGNOSIS — S335XXA Sprain of ligaments of lumbar spine, initial encounter: Secondary | ICD-10-CM | POA: Diagnosis not present

## 2021-01-03 DIAGNOSIS — S335XXA Sprain of ligaments of lumbar spine, initial encounter: Secondary | ICD-10-CM | POA: Diagnosis not present

## 2021-01-03 DIAGNOSIS — M9903 Segmental and somatic dysfunction of lumbar region: Secondary | ICD-10-CM | POA: Diagnosis not present

## 2021-01-03 DIAGNOSIS — M9901 Segmental and somatic dysfunction of cervical region: Secondary | ICD-10-CM | POA: Diagnosis not present

## 2021-01-12 DIAGNOSIS — M1711 Unilateral primary osteoarthritis, right knee: Secondary | ICD-10-CM | POA: Diagnosis not present

## 2021-01-19 DIAGNOSIS — M25561 Pain in right knee: Secondary | ICD-10-CM | POA: Diagnosis not present

## 2021-01-24 DIAGNOSIS — M9903 Segmental and somatic dysfunction of lumbar region: Secondary | ICD-10-CM | POA: Diagnosis not present

## 2021-01-24 DIAGNOSIS — M9901 Segmental and somatic dysfunction of cervical region: Secondary | ICD-10-CM | POA: Diagnosis not present

## 2021-01-24 DIAGNOSIS — S335XXA Sprain of ligaments of lumbar spine, initial encounter: Secondary | ICD-10-CM | POA: Diagnosis not present

## 2021-01-25 DIAGNOSIS — M25561 Pain in right knee: Secondary | ICD-10-CM | POA: Diagnosis not present

## 2021-02-02 DIAGNOSIS — H04123 Dry eye syndrome of bilateral lacrimal glands: Secondary | ICD-10-CM | POA: Diagnosis not present

## 2021-02-02 DIAGNOSIS — H02831 Dermatochalasis of right upper eyelid: Secondary | ICD-10-CM | POA: Diagnosis not present

## 2021-02-14 DIAGNOSIS — M3500 Sicca syndrome, unspecified: Secondary | ICD-10-CM | POA: Diagnosis not present

## 2021-02-14 DIAGNOSIS — M064 Inflammatory polyarthropathy: Secondary | ICD-10-CM | POA: Diagnosis not present

## 2021-02-14 DIAGNOSIS — G608 Other hereditary and idiopathic neuropathies: Secondary | ICD-10-CM | POA: Diagnosis not present

## 2021-02-14 DIAGNOSIS — Z681 Body mass index (BMI) 19 or less, adult: Secondary | ICD-10-CM | POA: Diagnosis not present

## 2021-02-14 DIAGNOSIS — M150 Primary generalized (osteo)arthritis: Secondary | ICD-10-CM | POA: Diagnosis not present

## 2021-02-15 DIAGNOSIS — M9901 Segmental and somatic dysfunction of cervical region: Secondary | ICD-10-CM | POA: Diagnosis not present

## 2021-02-15 DIAGNOSIS — S335XXA Sprain of ligaments of lumbar spine, initial encounter: Secondary | ICD-10-CM | POA: Diagnosis not present

## 2021-02-15 DIAGNOSIS — M9903 Segmental and somatic dysfunction of lumbar region: Secondary | ICD-10-CM | POA: Diagnosis not present

## 2021-03-02 DIAGNOSIS — M1711 Unilateral primary osteoarthritis, right knee: Secondary | ICD-10-CM | POA: Diagnosis not present

## 2021-03-07 DIAGNOSIS — M1711 Unilateral primary osteoarthritis, right knee: Secondary | ICD-10-CM | POA: Diagnosis not present

## 2021-03-07 DIAGNOSIS — M9903 Segmental and somatic dysfunction of lumbar region: Secondary | ICD-10-CM | POA: Diagnosis not present

## 2021-03-07 DIAGNOSIS — M9901 Segmental and somatic dysfunction of cervical region: Secondary | ICD-10-CM | POA: Diagnosis not present

## 2021-03-07 DIAGNOSIS — S335XXA Sprain of ligaments of lumbar spine, initial encounter: Secondary | ICD-10-CM | POA: Diagnosis not present

## 2021-03-14 DIAGNOSIS — M1711 Unilateral primary osteoarthritis, right knee: Secondary | ICD-10-CM | POA: Diagnosis not present

## 2021-03-21 DIAGNOSIS — S335XXA Sprain of ligaments of lumbar spine, initial encounter: Secondary | ICD-10-CM | POA: Diagnosis not present

## 2021-03-21 DIAGNOSIS — M9903 Segmental and somatic dysfunction of lumbar region: Secondary | ICD-10-CM | POA: Diagnosis not present

## 2021-03-21 DIAGNOSIS — M9901 Segmental and somatic dysfunction of cervical region: Secondary | ICD-10-CM | POA: Diagnosis not present

## 2021-03-21 DIAGNOSIS — M1711 Unilateral primary osteoarthritis, right knee: Secondary | ICD-10-CM | POA: Diagnosis not present

## 2021-04-03 DIAGNOSIS — M79673 Pain in unspecified foot: Secondary | ICD-10-CM | POA: Diagnosis not present

## 2021-04-03 DIAGNOSIS — Z6820 Body mass index (BMI) 20.0-20.9, adult: Secondary | ICD-10-CM | POA: Diagnosis not present

## 2021-04-03 DIAGNOSIS — M25579 Pain in unspecified ankle and joints of unspecified foot: Secondary | ICD-10-CM | POA: Diagnosis not present

## 2021-04-11 DIAGNOSIS — S335XXA Sprain of ligaments of lumbar spine, initial encounter: Secondary | ICD-10-CM | POA: Diagnosis not present

## 2021-04-11 DIAGNOSIS — M9901 Segmental and somatic dysfunction of cervical region: Secondary | ICD-10-CM | POA: Diagnosis not present

## 2021-04-11 DIAGNOSIS — M9903 Segmental and somatic dysfunction of lumbar region: Secondary | ICD-10-CM | POA: Diagnosis not present

## 2021-04-18 DIAGNOSIS — M1711 Unilateral primary osteoarthritis, right knee: Secondary | ICD-10-CM | POA: Diagnosis not present

## 2021-05-02 DIAGNOSIS — S335XXA Sprain of ligaments of lumbar spine, initial encounter: Secondary | ICD-10-CM | POA: Diagnosis not present

## 2021-05-02 DIAGNOSIS — M1711 Unilateral primary osteoarthritis, right knee: Secondary | ICD-10-CM | POA: Diagnosis not present

## 2021-05-02 DIAGNOSIS — Z130 Encounter for screening for diseases of the blood and blood-forming organs and certain disorders involving the immune mechanism: Secondary | ICD-10-CM | POA: Diagnosis not present

## 2021-05-02 DIAGNOSIS — M9901 Segmental and somatic dysfunction of cervical region: Secondary | ICD-10-CM | POA: Diagnosis not present

## 2021-05-02 DIAGNOSIS — Z8679 Personal history of other diseases of the circulatory system: Secondary | ICD-10-CM | POA: Diagnosis not present

## 2021-05-02 DIAGNOSIS — Z6820 Body mass index (BMI) 20.0-20.9, adult: Secondary | ICD-10-CM | POA: Diagnosis not present

## 2021-05-02 DIAGNOSIS — M9903 Segmental and somatic dysfunction of lumbar region: Secondary | ICD-10-CM | POA: Diagnosis not present

## 2021-05-02 DIAGNOSIS — D72819 Decreased white blood cell count, unspecified: Secondary | ICD-10-CM | POA: Diagnosis not present

## 2021-05-16 DIAGNOSIS — M9903 Segmental and somatic dysfunction of lumbar region: Secondary | ICD-10-CM | POA: Diagnosis not present

## 2021-05-16 DIAGNOSIS — M9901 Segmental and somatic dysfunction of cervical region: Secondary | ICD-10-CM | POA: Diagnosis not present

## 2021-05-16 DIAGNOSIS — S335XXA Sprain of ligaments of lumbar spine, initial encounter: Secondary | ICD-10-CM | POA: Diagnosis not present

## 2021-06-07 DIAGNOSIS — M1711 Unilateral primary osteoarthritis, right knee: Secondary | ICD-10-CM | POA: Diagnosis present

## 2021-06-07 NOTE — Progress Notes (Signed)
Sent message, via epic in basket, requesting orders in epic from surgeon.  

## 2021-06-13 NOTE — Patient Instructions (Addendum)
DUE TO COVID-19 ONLY ONE VISITOR IS ALLOWED TO COME WITH YOU AND STAY IN THE WAITING ROOM ONLY DURING PRE OP AND PROCEDURE.   **NO VISITORS ARE ALLOWED IN THE SHORT STAY AREA OR RECOVERY ROOM!!**  IF YOU WILL BE ADMITTED INTO THE HOSPITAL YOU ARE ALLOWED ONLY TWO SUPPORT PEOPLE DURING VISITATION HOURS ONLY (7 AM -8PM)    Up to two visitors ages 60+ are allowed at one time in a patient's room.  The visitors may rotate out with other people throughout the day.  Additionally, up to two children between the ages of 89 and 63 are allowed and do not count toward the number of allowed visitors.  Children within this age range must be accompanied by an adult visitor.  One adult visitor may remain with the patient overnight and must be in the room by 8 PM.  COVID SWAB TESTING MUST BE COMPLETED ON:  06-22-21 @ 9:45 AM   COME IN THROUGH MAIN ENTRANCE of [facility].  Take a seat in the lobby area to the right as you come in the main entrance.  Call [phone]  and give your name and let them know you are here for COVID testing  You are not required to quarantine, however you are required to wear a well-fitted mask when you are out and around people not in your household.  Hand Hygiene often Do NOT share personal items Notify your provider if you are in close contact with someone who has COVID or you develop fever 100.4 or greater, new onset of sneezing, cough, sore throat, shortness of breath or body aches.       Your procedure is scheduled on: Tuesday, 06-26-21   Report to [facility] Main  Entrance     Report to admitting at 10:45 AM   Call this number if you have problems the morning of surgery [phone]   Do not eat food :After Midnight.   May have liquids until 10:30 AM day of surgery  CLEAR LIQUID DIET  Foods Allowed                                                                     Foods Excluded  Water, Black Coffee (no milk/no creamer) and tea, regular and decaf                               liquids that you cannot  Plain Jell-O in any flavor  (No red)                         see through such as: Fruit ices (not with fruit pulp)                                 milk, soups, orange juice  Iced Popsicles (No red)                                    All solid food  Apple juices Sports drinks like Gatorade (No red) Lightly seasoned clear broth or consume(fat free) Sugar   Complete one Ensure drink the morning of surgery at  10:30 AM the day of surgery.      The day of surgery:  Drink ONE (1) Pre-Surgery Clear Ensure the morning of surgery. Drink in one sitting. Do not sip.  This drink was given to you during your hospital  pre-op appointment visit. Nothing else to drink after completing the Pre-Surgery Clear Ensure.          If you have questions, please contact your surgeons office.     Oral Hygiene is also important to reduce your risk of infection.                                    Remember - BRUSH YOUR TEETH THE MORNING OF SURGERY WITH YOUR REGULAR TOOTHPASTE   Do NOT smoke after Midnight   Take these medicines the morning of surgery with A SIP OF WATER: Okay to use Albuterol inhaler and Restasis   Stop all vitamins and herbal supplements a week before surgery             You may not have any metal on your body including hair pins, jewelry, and body piercing             Do not wear make-up, lotions, powders, perfumes or deodorant  Do not wear nail polish including gel and S&S, artificial/acrylic nails, or any other type of covering on natural nails including finger and toenails. If you have artificial nails, gel coating, etc. that needs to be removed by a nail salon please have this removed prior to surgery or surgery may need to be canceled/ delayed if the surgeon/ anesthesia feels like they are unable to be safely monitored.   Do not shave  48 hours prior to surgery.   Do not bring valuables to the hospital. [place].   Contacts, dentures or bridgework may not be worn into surgery.   Bring small overnight bag day of surgery.  Special Instructions: Bring a copy of your healthcare power of attorney and living will documents the day of surgery if you haven't scanned them in before.  Please read over the following fact sheets you were given: IF YOU HAVE QUESTIONS ABOUT YOUR PRE OP INSTRUCTIONS PLEASE CALL [facility] - Preparing for Surgery Before surgery, you can play an important role.  Because skin is not sterile, your skin needs to be as free of germs as possible.  You can reduce the number of germs on your skin by washing with CHG (chlorahexidine gluconate) soap before surgery.  CHG is an antiseptic cleaner which kills germs and bonds with the skin to continue killing germs even after washing. Please DO NOT use if you have an allergy to CHG or antibacterial soaps.  If your skin becomes reddened/irritated stop using the CHG and inform your nurse when you arrive at Short Stay. Do not shave (including legs and underarms) for at least 48 hours prior to the first CHG shower.  You may shave your face/neck.  Please follow these instructions carefully:  1.  Shower with CHG Soap the night before surgery and the  morning of surgery.  2.  If you choose to wash your hair, wash your hair first as usual with your normal  shampoo.  3.  After you shampoo, rinse your hair and body thoroughly to remove the shampoo.                             4.  Use CHG as you would any other liquid soap.  You can apply chg directly to the skin and wash.  Gently with a scrungie or clean washcloth.  5.  Apply the CHG Soap to your body ONLY FROM THE NECK DOWN.   Do   not use on face/ open                           Wound or open sores. Avoid contact with eyes, ears mouth and   genitals (private parts).                       Wash face,  Genitals (private parts) with your normal  soap.             6.  Wash thoroughly, paying special attention to the area where your    surgery  will be performed.  7.  Thoroughly rinse your body with warm water from the neck down.  8.  DO NOT shower/wash with your normal soap after using and rinsing off the CHG Soap.                9.  Pat yourself dry with a clean towel.            10.  Wear clean pajamas.            11.  Place clean sheets on your bed the night of your first shower and do not  sleep with pets. Day of Surgery : Do not apply any lotions/deodorants the morning of surgery.  Please wear clean clothes to the hospital/surgery center.  FAILURE TO FOLLOW THESE INSTRUCTIONS MAY RESULT IN THE CANCELLATION OF YOUR SURGERY  PATIENT SIGNATURE_________________________________  NURSE SIGNATURE__________________________________  ________________________________________________________________________   Tammy Sutton  An incentive spirometer is a tool that can help keep your lungs clear and active. This tool measures how well you are filling your lungs with each breath. Taking long deep breaths may help reverse or decrease the chance of developing breathing (pulmonary) problems (especially infection) following: A long period of time when you are unable to move or be active. BEFORE THE PROCEDURE  If the spirometer includes an indicator to show your best effort, your nurse or respiratory therapist will set it to a desired goal. If possible, sit up straight or lean slightly forward. Try not to slouch. Hold the incentive spirometer in an upright position. INSTRUCTIONS FOR USE  Sit on the edge of your bed if possible, or sit up as far as you can in bed or on a chair. Hold the incentive spirometer in an upright position. Breathe out normally. Place the mouthpiece in your mouth and seal your lips tightly around it. Breathe in slowly and as deeply as possible, raising the piston or the ball toward the top of the column. Hold  your breath for 3-5 seconds or for as long as possible. Allow the piston or ball to fall to the bottom of the column. Remove the mouthpiece from your mouth and breathe out normally. Rest for a few seconds and repeat Steps 1 through 7 at least 10 times every 1-2 hours when you are awake. Take your  time and take a few normal breaths between deep breaths. The spirometer may include an indicator to show your best effort. Use the indicator as a goal to work toward during each repetition. After each set of 10 deep breaths, practice coughing to be sure your lungs are clear. If you have an incision (the cut made at the time of surgery), support your incision when coughing by placing a pillow or rolled up towels firmly against it. Once you are able to get out of bed, walk around indoors and cough well. You may stop using the incentive spirometer when instructed by your caregiver.  RISKS AND COMPLICATIONS Take your time so you do not get dizzy or light-headed. If you are in pain, you may need to take or ask for pain medication before doing incentive spirometry. It is harder to take a deep breath if you are having pain. AFTER USE Rest and breathe slowly and easily. It can be helpful to keep track of a log of your progress. Your caregiver can provide you with a simple table to help with this. If you are using the spirometer at home, follow these instructions: Startex IF:  You are having difficultly using the spirometer. You have trouble using the spirometer as often as instructed. Your pain medication is not giving enough relief while using the spirometer. You develop fever of 100.5 F (38.1 C) or higher. SEEK IMMEDIATE MEDICAL CARE IF:  You cough up bloody sputum that had not been present before. You develop fever of 102 F (38.9 C) or greater. You develop worsening pain at or near the incision site. MAKE SURE YOU:  Understand these instructions. Will watch your condition. Will get help  right away if you are not doing well or get worse. Document Released: 09/23/2006 Document Revised: 08/05/2011 Document Reviewed: 11/24/2006 [facility] Patient Information 2014 [place], [place].   ________________________________________________________________________

## 2021-06-15 ENCOUNTER — Other Ambulatory Visit: Payer: Self-pay

## 2021-06-15 ENCOUNTER — Encounter (HOSPITAL_COMMUNITY): Payer: Self-pay

## 2021-06-15 ENCOUNTER — Encounter (HOSPITAL_COMMUNITY)
Admission: RE | Admit: 2021-06-15 | Discharge: 2021-06-15 | Disposition: A | Discharge status: Home / Self Care | Payer: PPO | Source: Ambulatory Visit | Attending: Orthopedic Surgery | Admitting: Orthopedic Surgery

## 2021-06-15 VITALS — BP 151/106 | HR 91 | Temp 99.0°F | Resp 16 | Ht 66.0 in | Wt 118.8 lb

## 2021-06-15 DIAGNOSIS — Z01812 Encounter for preprocedural laboratory examination: Secondary | ICD-10-CM | POA: Insufficient documentation

## 2021-06-15 DIAGNOSIS — Z01818 Encounter for other preprocedural examination: Secondary | ICD-10-CM

## 2021-06-15 HISTORY — DX: Unspecified osteoarthritis, unspecified site: M19.90

## 2021-06-15 HISTORY — DX: Nausea with vomiting, unspecified: R11.2

## 2021-06-15 HISTORY — DX: Other specified postprocedural states: Z98.890

## 2021-06-15 HISTORY — DX: Sjogren syndrome, unspecified: M35.00

## 2021-06-15 HISTORY — DX: Unspecified asthma, uncomplicated: J45.909

## 2021-06-15 LAB — CBC
HCT: 32.9 % — ABNORMAL LOW (ref 36.0–46.0)
Hemoglobin: 10.7 g/dL — ABNORMAL LOW (ref 12.0–15.0)
MCH: 28.2 pg (ref 26.0–34.0)
MCHC: 32.5 g/dL (ref 30.0–36.0)
MCV: 86.8 fL (ref 80.0–100.0)
Platelets: 269 10*3/uL (ref 150–400)
RBC: 3.79 MIL/uL — ABNORMAL LOW (ref 3.87–5.11)
RDW: 14.8 % (ref 11.5–15.5)
WBC: 3.7 10*3/uL — ABNORMAL LOW (ref 4.0–10.5)
nRBC: 0 % (ref 0.0–0.2)

## 2021-06-15 LAB — BASIC METABOLIC PANEL
Anion gap: 7 (ref 5–15)
BUN: 24 mg/dL — ABNORMAL HIGH (ref 8–23)
CO2: 24 mmol/L (ref 22–32)
Calcium: 8.8 mg/dL — ABNORMAL LOW (ref 8.9–10.3)
Chloride: 104 mmol/L (ref 98–111)
Creatinine, Ser: 1.32 mg/dL — ABNORMAL HIGH (ref 0.44–1.00)
GFR, Estimated: 42 mL/min — ABNORMAL LOW (ref >60)
Glucose, Bld: 102 mg/dL — ABNORMAL HIGH (ref 70–99)
Potassium: 4.6 mmol/L (ref 3.5–5.1)
Sodium: 135 mmol/L (ref 135–145)

## 2021-06-15 LAB — SURGICAL PCR SCREEN
MRSA, PCR: NEGATIVE
Staphylococcus aureus: POSITIVE — AB

## 2021-06-15 NOTE — Progress Notes (Addendum)
COVID swab appointment:06-22-21 @ 9:45 AM  COVID Vaccine Completed:Yes x2 Date COVID Vaccine completed: Has received booster:  Yes x2 COVID vaccine manufacturer: [employer]   Date of COVID positive in last 90 days:  No  PCP - Stana Bunting, MD (office note requested) Cardiologist - N/A  Chest x-ray - N/A EKG - 03-2021 requested Stress Test - N/A ECHO - N/A Cardiac Cath - N/A Pacemaker/ICD device last checked: Spinal Cord Stimulator:  Sleep Study - N/A CPAP -   Fasting Blood Sugar - N/A Checks Blood Sugar _____ times a day  Blood Thinner Instructions:N/A Aspirin Instructions: Last Dose:  Activity level:  Can go up a flight of stairs and perform activities of daily living without stopping and without symptoms of chest pain or shortness of breath.   Anesthesia review:  BP elevated at PAT with no diagnosis of HTN.  Initial BP 188/93 and repeat 151/106.  Patient denied headache, chest pain or SOB.  Patient very anxious at appointment.  Will recheck at home and call PCP if it remains elevated.  Patient denies shortness of breath, fever, cough and chest pain at PAT appointment  Patient verbalized understanding of instructions that were given to them at the PAT appointment. Patient was also instructed that they will need to review over the PAT instructions again at home before surgery.

## 2021-06-15 NOTE — Progress Notes (Signed)
PCR results sent to Dr. Landau to review. 

## 2021-06-19 NOTE — H&P (Addendum)
KNEE ARTHROPLASTY ADMISSION H&P  Patient ID: Tammy Sutton MRN: [id number] DOB/AGE: 12/01/1945 76 y.o.  Chief Complaint: right knee pain.  Planned Procedure Date: 06/14/21  Medical Clearance by Dr. Stana Bunting  HPI: Tammy Sutton is a 76 y.o. female who presents for evaluation of djd right knee. The patient has a history of pain and functional disability in the right knee due to arthritis and has failed non-surgical conservative treatments for greater than 12 weeks to include NSAID's and/or analgesics, corticosteriod injections, viscosupplementation injections, and activity modification.  Onset of symptoms was gradual, starting >10 years ago with gradually worsening course since that time. The patient noted no past surgery on the right knee.  Patient currently rates pain at 3 out of 10 with activity. Patient has worsening of pain with activity and weight bearing and pain that interferes with activities of daily living.  Patient has evidence of joint space narrowing by imaging studies.  There is no active infection.  Past Medical History:  Diagnosis Date   Arthritis    Asthma    PONV (postoperative nausea and vomiting)    Sjogren's syndrome (HCC)    Past Surgical History:  Procedure Laterality Date   BREAST BIOPSY Left    CATARACT EXTRACTION W/ INTRAOCULAR LENS IMPLANT Bilateral    COLONOSCOPY     Dental implant     TUBAL LIGATION     Allergies  Allergen Reactions   Clindamycin/Lincomycin Itching   Sulfamethoxazole-Trimethoprim Itching    Bactrim    Prior to Admission medications   Medication Sig Start Date End Date Taking? Authorizing Provider  albuterol (VENTOLIN HFA) 108 (90 Base) MCG/ACT inhaler Inhale 2 puffs into the lungs every 6 (six) hours as needed for wheezing or shortness of breath.   Yes [provider]  RESTASIS 0.05 % ophthalmic emulsion Place 1 drop into both eyes daily. 02/02/21  Yes [provider]   Social History   Socioeconomic  History   Marital status: Divorced    Spouse name: Not on file   Number of children: Not on file   Years of education: Not on file   Highest education level: Not on file  Occupational History   Not on file  Tobacco Use   Smoking status: Never   Smokeless tobacco: Never  Vaping Use   Vaping Use: Never used  Substance and Sexual Activity   Alcohol use: Yes    Comment: Rare   Drug use: Never   Sexual activity: Not on file  Other Topics Concern   Not on file  Social History Narrative   Not on file   Social Determinants of Health   Financial Resource Strain: Not on file  Food Insecurity: Not on file  Transportation Needs: Not on file  Physical Activity: Not on file  Stress: Not on file  Social Connections: Not on file   No family history on file.  ROS: Currently denies lightheadedness, dizziness, Fever, chills, CP, SOB.  No personal history of DVT, PE, MI, or CVA. No loose teeth or dentures All other systems have been reviewed and were otherwise currently negative with the exception of those mentioned in the HPI and as above.  Objective: Vitals: Ht: 65" Wt: 120.3 lbs Temp: 98.2 BP: 170/93 Pulse: 75 O2 100% on room air.   Physical Exam: General: Alert, NAD.  Antalgic Gait  HEENT: EOMI, Good Neck Extension  Pulm: No increased work of breathing.  Clear B/L A/P w/o crackle or wheeze.  CV: RRR, No m/g/r  appreciated  GI: soft, NT, ND Neuro: Neuro without gross focal deficit.  Sensation intact distally Skin: No lesions in the area of chief complaint MSK/Surgical Site: right knee w/o redness or effusion.  lateral JLT. ROM 0-120.  5/5 strength in extension and flexion.  +EHL/FHL.  NVI.  Stable varus and valgus stress.    Imaging Review Plain radiographs demonstrate severe degenerative joint disease of the right knee.   The overall alignment isneutral. The bone quality appears to be adequate for age and reported activity level.  Preoperative templating of the joint  replacement has been completed, documented, and submitted to the Operating Room personnel in order to optimize intra-operative equipment management.  Assessment: djd right knee Principal Problem:   Osteoarthritis of right knee   Plan: Plan for Procedure(s): TOTAL KNEE ARTHROPLASTY  The patient history, physical exam, clinical judgement of the provider and imaging are consistent with end stage degenerative joint disease and total joint arthroplasty is deemed medically necessary. The treatment options including medical management, injection therapy, and arthroplasty were discussed at length. The risks and benefits of Procedure(s): TOTAL KNEE ARTHROPLASTY were presented and reviewed.  The risks of nonoperative treatment, versus surgical intervention including but not limited to continued pain, aseptic loosening, stiffness, dislocation/subluxation, infection, bleeding, nerve injury, blood clots, cardiopulmonary complications, morbidity, mortality, among others were discussed. The patient verbalizes understanding and wishes to proceed with the plan.  Patient is being admitted for inpatient treatment for surgery, pain control, PT, prophylactic antibiotics, VTE prophylaxis, progressive ambulation, ADL's and discharge planning.   Dental prophylaxis discussed and recommended for 2 years postoperatively.  The patient does  meet the criteria for TXA which will be used perioperatively.   ASA 81 mg BID  will be used postoperatively for DVT prophylaxis in addition to SCDs, and early ambulation. The patient is planning to be discharged home with OPPT in care of her boyfriend and friend Deneen Harts, PA-C 06/19/2021 1:45 PM

## 2021-06-22 ENCOUNTER — Encounter (HOSPITAL_COMMUNITY)
Admission: RE | Admit: 2021-06-22 | Discharge: 2021-06-22 | Disposition: A | Discharge status: Home / Self Care | Payer: PPO | Source: Ambulatory Visit | Attending: Orthopedic Surgery | Admitting: Orthopedic Surgery

## 2021-06-22 ENCOUNTER — Other Ambulatory Visit: Payer: Self-pay

## 2021-06-22 DIAGNOSIS — Z20822 Contact with and (suspected) exposure to covid-19: Secondary | ICD-10-CM | POA: Diagnosis not present

## 2021-06-22 DIAGNOSIS — Z01818 Encounter for other preprocedural examination: Secondary | ICD-10-CM

## 2021-06-22 DIAGNOSIS — Z01812 Encounter for preprocedural laboratory examination: Secondary | ICD-10-CM | POA: Insufficient documentation

## 2021-06-22 LAB — SARS CORONAVIRUS 2 (TAT 6-24 HRS): SARS Coronavirus 2: NEGATIVE

## 2021-06-26 ENCOUNTER — Ambulatory Visit (HOSPITAL_COMMUNITY): Payer: PPO | Admitting: Physician Assistant

## 2021-06-26 ENCOUNTER — Ambulatory Visit (HOSPITAL_COMMUNITY)
Admission: RE | Admit: 2021-06-26 | Discharge: 2021-06-27 | Disposition: A | Discharge status: Home / Self Care | Payer: PPO | Source: Ambulatory Visit | Attending: Orthopedic Surgery | Admitting: Orthopedic Surgery

## 2021-06-26 ENCOUNTER — Encounter (HOSPITAL_COMMUNITY)
Admission: RE | Disposition: A | Discharge status: Home / Self Care | Payer: Self-pay | Source: Ambulatory Visit | Attending: Orthopedic Surgery

## 2021-06-26 ENCOUNTER — Ambulatory Visit (HOSPITAL_COMMUNITY): Payer: PPO

## 2021-06-26 ENCOUNTER — Ambulatory Visit (HOSPITAL_COMMUNITY): Payer: PPO | Admitting: Anesthesiology

## 2021-06-26 ENCOUNTER — Encounter (HOSPITAL_COMMUNITY): Payer: Self-pay | Admitting: Orthopedic Surgery

## 2021-06-26 ENCOUNTER — Other Ambulatory Visit: Payer: Self-pay

## 2021-06-26 DIAGNOSIS — J45909 Unspecified asthma, uncomplicated: Secondary | ICD-10-CM | POA: Diagnosis not present

## 2021-06-26 DIAGNOSIS — Z96651 Presence of right artificial knee joint: Secondary | ICD-10-CM | POA: Diagnosis present

## 2021-06-26 DIAGNOSIS — D62 Acute posthemorrhagic anemia: Secondary | ICD-10-CM | POA: Insufficient documentation

## 2021-06-26 DIAGNOSIS — M3500 Sicca syndrome, unspecified: Secondary | ICD-10-CM | POA: Insufficient documentation

## 2021-06-26 DIAGNOSIS — Z79899 Other long term (current) drug therapy: Secondary | ICD-10-CM | POA: Diagnosis not present

## 2021-06-26 DIAGNOSIS — M1711 Unilateral primary osteoarthritis, right knee: Secondary | ICD-10-CM | POA: Diagnosis not present

## 2021-06-26 HISTORY — PX: TOTAL KNEE ARTHROPLASTY: SHX125

## 2021-06-26 LAB — HEMOGLOBIN AND HEMATOCRIT, BLOOD
HCT: 30.6 % — ABNORMAL LOW (ref 36.0–46.0)
Hemoglobin: 10 g/dL — ABNORMAL LOW (ref 12.0–15.0)

## 2021-06-26 SURGERY — ARTHROPLASTY, KNEE, TOTAL
Anesthesia: Spinal | Site: Knee | Laterality: Right

## 2021-06-26 MED ORDER — DOCUSATE SODIUM 100 MG PO CAPS
100.0000 mg | ORAL_CAPSULE | Freq: Two times a day (BID) | ORAL | Status: DC
  Administered 2021-06-26 – 2021-06-27 (×2): 100 mg via ORAL
  Filled 2021-06-26 (×2): qty 1

## 2021-06-26 MED ORDER — KETOROLAC TROMETHAMINE 30 MG/ML IJ SOLN
INTRAMUSCULAR | Status: AC
  Filled 2021-06-26: qty 1

## 2021-06-26 MED ORDER — CEFAZOLIN SODIUM-DEXTROSE 2-4 GM/100ML-% IV SOLN
2.0000 g | INTRAVENOUS | Status: AC
  Administered 2021-06-26: 2 g via INTRAVENOUS
  Filled 2021-06-26: qty 100

## 2021-06-26 MED ORDER — PROPOFOL 500 MG/50ML IV EMUL
INTRAVENOUS | Status: DC | PRN
  Administered 2021-06-26: 75 ug/kg/min via INTRAVENOUS

## 2021-06-26 MED ORDER — OXYCODONE HCL 5 MG PO TABS: 5.0000 mg | ORAL_TABLET | ORAL | 0 refills | Status: DC | PRN

## 2021-06-26 MED ORDER — ONDANSETRON HCL 4 MG/2ML IJ SOLN
INTRAMUSCULAR | Status: AC
  Filled 2021-06-26: qty 2

## 2021-06-26 MED ORDER — ONDANSETRON HCL 4 MG/2ML IJ SOLN: 4.0000 mg | Freq: Four times a day (QID) | INTRAMUSCULAR | Status: DC | PRN

## 2021-06-26 MED ORDER — MENTHOL 3 MG MT LOZG: 1.0000 | LOZENGE | OROMUCOSAL | Status: DC | PRN

## 2021-06-26 MED ORDER — TRANEXAMIC ACID-NACL 1000-0.7 MG/100ML-% IV SOLN
1000.0000 mg | INTRAVENOUS | Status: AC
  Administered 2021-06-26: 1000 mg via INTRAVENOUS
  Filled 2021-06-26: qty 100

## 2021-06-26 MED ORDER — ACETAMINOPHEN 500 MG PO TABS
1000.0000 mg | ORAL_TABLET | Freq: Once | ORAL | Status: AC
  Administered 2021-06-26: 1000 mg via ORAL
  Filled 2021-06-26: qty 2

## 2021-06-26 MED ORDER — ASPIRIN EC 81 MG PO TBEC: 81.0000 mg | DELAYED_RELEASE_TABLET | Freq: Two times a day (BID) | ORAL | 0 refills | Status: DC

## 2021-06-26 MED ORDER — ACETAMINOPHEN 500 MG PO TABS
1000.0000 mg | ORAL_TABLET | Freq: Four times a day (QID) | ORAL | Status: AC
  Administered 2021-06-26 – 2021-06-27 (×4): 1000 mg via ORAL
  Filled 2021-06-26 (×5): qty 2

## 2021-06-26 MED ORDER — POVIDONE-IODINE 10 % EX SWAB
2.0000 "application " | Freq: Once | CUTANEOUS | Status: AC
  Administered 2021-06-26: 2 via TOPICAL

## 2021-06-26 MED ORDER — ONDANSETRON HCL 4 MG PO TABS
4.0000 mg | ORAL_TABLET | Freq: Four times a day (QID) | ORAL | Status: DC | PRN
  Administered 2021-06-27: 4 mg via ORAL
  Filled 2021-06-26: qty 1

## 2021-06-26 MED ORDER — ACETAMINOPHEN 325 MG PO TABS: 325.0000 mg | ORAL_TABLET | Freq: Four times a day (QID) | ORAL | Status: DC | PRN

## 2021-06-26 MED ORDER — FENTANYL CITRATE PF 50 MCG/ML IJ SOSY
50.0000 ug | PREFILLED_SYRINGE | Freq: Once | INTRAMUSCULAR | Status: AC
  Administered 2021-06-26: 50 ug via INTRAVENOUS
  Filled 2021-06-26: qty 2

## 2021-06-26 MED ORDER — ROPIVACAINE HCL 5 MG/ML IJ SOLN
INTRAMUSCULAR | Status: DC | PRN
Start: 2021-06-26 — End: 2021-06-26
  Administered 2021-06-26: 30 mL via PERINEURAL

## 2021-06-26 MED ORDER — CEFAZOLIN SODIUM-DEXTROSE 2-4 GM/100ML-% IV SOLN
2.0000 g | Freq: Four times a day (QID) | INTRAVENOUS | Status: AC
  Administered 2021-06-26: 2 g via INTRAVENOUS
  Filled 2021-06-26: qty 100

## 2021-06-26 MED ORDER — POTASSIUM CHLORIDE IN NACL 20-0.45 MEQ/L-% IV SOLN
INTRAVENOUS | Status: DC
  Filled 2021-06-26: qty 1000

## 2021-06-26 MED ORDER — OXYCODONE HCL 5 MG PO TABS: 10.0000 mg | ORAL_TABLET | ORAL | Status: DC | PRN

## 2021-06-26 MED ORDER — ALBUTEROL SULFATE (2.5 MG/3ML) 0.083% IN NEBU: 3.0000 mL | INHALATION_SOLUTION | Freq: Four times a day (QID) | RESPIRATORY_TRACT | Status: DC | PRN

## 2021-06-26 MED ORDER — HYDROMORPHONE HCL 1 MG/ML IJ SOLN: 0.5000 mg | INTRAMUSCULAR | Status: DC | PRN

## 2021-06-26 MED ORDER — KETOROLAC TROMETHAMINE 30 MG/ML IJ SOLN
INTRAMUSCULAR | Status: DC | PRN
  Administered 2021-06-26: 30 mg

## 2021-06-26 MED ORDER — PROPOFOL 1000 MG/100ML IV EMUL
INTRAVENOUS | Status: AC
  Filled 2021-06-26: qty 100

## 2021-06-26 MED ORDER — BISACODYL 10 MG RE SUPP: 10.0000 mg | Freq: Every day | RECTAL | Status: DC | PRN

## 2021-06-26 MED ORDER — BUPIVACAINE HCL 0.25 % IJ SOLN
INTRAMUSCULAR | Status: AC
  Filled 2021-06-26: qty 1

## 2021-06-26 MED ORDER — ORAL CARE MOUTH RINSE: 15.0000 mL | Freq: Once | OROMUCOSAL | Status: AC

## 2021-06-26 MED ORDER — SENNA-DOCUSATE SODIUM 8.6-50 MG PO TABS: 2.0000 | ORAL_TABLET | Freq: Every day | ORAL | 1 refills | Status: DC

## 2021-06-26 MED ORDER — PHENYLEPHRINE 40 MCG/ML (10ML) SYRINGE FOR IV PUSH (FOR BLOOD PRESSURE SUPPORT)
PREFILLED_SYRINGE | INTRAVENOUS | Status: DC | PRN
  Administered 2021-06-26: 80 ug via INTRAVENOUS

## 2021-06-26 MED ORDER — METHOCARBAMOL 500 MG PO TABS
500.0000 mg | ORAL_TABLET | Freq: Four times a day (QID) | ORAL | Status: DC | PRN
  Administered 2021-06-26: 500 mg via ORAL
  Filled 2021-06-26: qty 1

## 2021-06-26 MED ORDER — DIPHENHYDRAMINE HCL 12.5 MG/5ML PO ELIX
12.5000 mg | ORAL_SOLUTION | ORAL | Status: DC | PRN
  Administered 2021-06-27: 25 mg via ORAL
  Filled 2021-06-26: qty 10

## 2021-06-26 MED ORDER — LACTATED RINGERS IV BOLUS
500.0000 mL | Freq: Once | INTRAVENOUS | Status: AC
  Administered 2021-06-26: 500 mL via INTRAVENOUS

## 2021-06-26 MED ORDER — MIDAZOLAM HCL 2 MG/2ML IJ SOLN
1.0000 mg | Freq: Once | INTRAMUSCULAR | Status: DC
  Filled 2021-06-26: qty 2

## 2021-06-26 MED ORDER — HYDRALAZINE HCL 20 MG/ML IJ SOLN
INTRAMUSCULAR | Status: AC
  Filled 2021-06-26: qty 1

## 2021-06-26 MED ORDER — 0.9 % SODIUM CHLORIDE (POUR BTL) OPTIME
TOPICAL | Status: DC | PRN
Start: 2021-06-26 — End: 2021-06-26
  Administered 2021-06-26: 750 mL

## 2021-06-26 MED ORDER — DEXAMETHASONE SODIUM PHOSPHATE 10 MG/ML IJ SOLN
10.0000 mg | Freq: Once | INTRAMUSCULAR | Status: AC
  Administered 2021-06-27: 10 mg via INTRAVENOUS
  Filled 2021-06-26: qty 1

## 2021-06-26 MED ORDER — CHLORHEXIDINE GLUCONATE 0.12 % MT SOLN
15.0000 mL | Freq: Once | OROMUCOSAL | Status: AC
  Administered 2021-06-26: 15 mL via OROMUCOSAL

## 2021-06-26 MED ORDER — LACTATED RINGERS IV BOLUS
250.0000 mL | Freq: Once | INTRAVENOUS | Status: AC
  Administered 2021-06-27: 250 mL via INTRAVENOUS

## 2021-06-26 MED ORDER — CYCLOSPORINE 0.05 % OP EMUL
1.0000 [drp] | Freq: Every day | OPHTHALMIC | Status: DC
  Administered 2021-06-27: 1 [drp] via OPHTHALMIC
  Filled 2021-06-26: qty 30

## 2021-06-26 MED ORDER — ONDANSETRON HCL 4 MG/2ML IJ SOLN: 4.0000 mg | Freq: Once | INTRAMUSCULAR | Status: DC | PRN

## 2021-06-26 MED ORDER — SODIUM CHLORIDE 0.9 % IR SOLN
Status: DC | PRN
  Administered 2021-06-26: 1000 mL

## 2021-06-26 MED ORDER — METOCLOPRAMIDE HCL 5 MG PO TABS: 5.0000 mg | ORAL_TABLET | Freq: Three times a day (TID) | ORAL | Status: DC | PRN

## 2021-06-26 MED ORDER — PHENYLEPHRINE 40 MCG/ML (10ML) SYRINGE FOR IV PUSH (FOR BLOOD PRESSURE SUPPORT)
PREFILLED_SYRINGE | INTRAVENOUS | Status: AC
  Filled 2021-06-26: qty 10

## 2021-06-26 MED ORDER — HYDRALAZINE HCL 20 MG/ML IJ SOLN
5.0000 mg | INTRAMUSCULAR | Status: AC | PRN
  Administered 2021-06-26 (×2): 5 mg via INTRAVENOUS

## 2021-06-26 MED ORDER — OXYCODONE HCL 5 MG PO TABS: 5.0000 mg | ORAL_TABLET | ORAL | Status: DC | PRN

## 2021-06-26 MED ORDER — METHOCARBAMOL 500 MG IVPB - SIMPLE MED
500.0000 mg | Freq: Four times a day (QID) | INTRAVENOUS | Status: DC | PRN
  Filled 2021-06-26: qty 50

## 2021-06-26 MED ORDER — LACTATED RINGERS IV SOLN
INTRAVENOUS | Status: DC
Start: 2021-06-26 — End: 2021-06-26

## 2021-06-26 MED ORDER — METOCLOPRAMIDE HCL 5 MG/ML IJ SOLN: 5.0000 mg | Freq: Three times a day (TID) | INTRAMUSCULAR | Status: DC | PRN

## 2021-06-26 MED ORDER — PROPOFOL 10 MG/ML IV BOLUS
INTRAVENOUS | Status: DC | PRN
  Administered 2021-06-26: 20 mg via INTRAVENOUS

## 2021-06-26 MED ORDER — POLYETHYLENE GLYCOL 3350 17 G PO PACK: 17.0000 g | PACK | Freq: Every day | ORAL | Status: DC | PRN

## 2021-06-26 MED ORDER — BUPIVACAINE HCL 0.25 % IJ SOLN
INTRAMUSCULAR | Status: DC | PRN
  Administered 2021-06-26: 30 mL

## 2021-06-26 MED ORDER — PHENOL 1.4 % MT LIQD: 1.0000 | OROMUCOSAL | Status: DC | PRN

## 2021-06-26 MED ORDER — TRANEXAMIC ACID-NACL 1000-0.7 MG/100ML-% IV SOLN: 1000.0000 mg | Freq: Once | INTRAVENOUS | Status: DC

## 2021-06-26 MED ORDER — BUPIVACAINE IN DEXTROSE 0.75-8.25 % IT SOLN
INTRATHECAL | Status: DC | PRN
  Administered 2021-06-26: 2 mL via INTRATHECAL

## 2021-06-26 MED ORDER — LACTATED RINGERS IV BOLUS
250.0000 mL | Freq: Once | INTRAVENOUS | Status: AC
  Administered 2021-06-26: 250 mL via INTRAVENOUS

## 2021-06-26 MED ORDER — POVIDONE-IODINE 10 % EX SWAB: 2.0000 "application " | Freq: Once | CUTANEOUS | Status: DC

## 2021-06-26 MED ORDER — TRANEXAMIC ACID-NACL 1000-0.7 MG/100ML-% IV SOLN
INTRAVENOUS | Status: AC
  Administered 2021-06-26: 1000 mg
  Filled 2021-06-26: qty 100

## 2021-06-26 MED ORDER — CEFAZOLIN SODIUM-DEXTROSE 2-4 GM/100ML-% IV SOLN
INTRAVENOUS | Status: AC
  Administered 2021-06-26: 2000 mg
  Filled 2021-06-26: qty 100

## 2021-06-26 MED ORDER — ONDANSETRON HCL 4 MG/2ML IJ SOLN
INTRAMUSCULAR | Status: DC | PRN
  Administered 2021-06-26: 4 mg via INTRAVENOUS

## 2021-06-26 MED ORDER — FENTANYL CITRATE PF 50 MCG/ML IJ SOSY: 25.0000 ug | PREFILLED_SYRINGE | INTRAMUSCULAR | Status: DC | PRN

## 2021-06-26 MED ORDER — ALUM & MAG HYDROXIDE-SIMETH 200-200-20 MG/5ML PO SUSP: 30.0000 mL | ORAL | Status: DC | PRN

## 2021-06-26 MED ORDER — ASPIRIN EC 325 MG PO TBEC
325.0000 mg | DELAYED_RELEASE_TABLET | Freq: Every day | ORAL | Status: DC
  Administered 2021-06-27: 325 mg via ORAL
  Filled 2021-06-26: qty 1

## 2021-06-26 SURGICAL SUPPLY — 55 items
ATTUNE MED DOME PAT 38 KNEE (Knees) ×1 IMPLANT
ATTUNE PS FEM RT SZ 5 CEM KNEE (Femur) ×1 IMPLANT
BAG COUNTER SPONGE SURGICOUNT (BAG) ×1 IMPLANT
BAG ZIPLOCK 12X15 (MISCELLANEOUS) ×1 IMPLANT
BASEPLATE TIB CMT FB PCKT SZ5 (Knees) ×1 IMPLANT
BLADE SAG 18X100X1.27 (BLADE) ×2 IMPLANT
BLADE SAW SGTL 11.0X1.19X90.0M (BLADE) ×2 IMPLANT
BLADE SAW SGTL 13X75X1.27 (BLADE) ×2 IMPLANT
BLADE SURG 15 STRL LF DISP TIS (BLADE) ×1 IMPLANT
BLADE SURG 15 STRL SS (BLADE) ×1
BNDG ELASTIC 6X10 VLCR STRL LF (GAUZE/BANDAGES/DRESSINGS) ×2 IMPLANT
BOWL SMART MIX CTS (DISPOSABLE) ×2 IMPLANT
CEMENT HV SMART SET (Cement) ×4 IMPLANT
CLSR STERI-STRIP ANTIMIC 1/2X4 (GAUZE/BANDAGES/DRESSINGS) ×4 IMPLANT
COVER SURGICAL LIGHT HANDLE (MISCELLANEOUS) ×2 IMPLANT
CUFF TOURN SGL QUICK 34 (TOURNIQUET CUFF) ×1
CUFF TRNQT CYL 34X4.125X (TOURNIQUET CUFF) ×1 IMPLANT
DECANTER SPIKE VIAL GLASS SM (MISCELLANEOUS) ×1 IMPLANT
DRAPE SHEET LG 3/4 BI-LAMINATE (DRAPES) ×2 IMPLANT
DRAPE U-SHAPE 47X51 STRL (DRAPES) ×2 IMPLANT
DRSG MEPILEX BORDER 4X12 (GAUZE/BANDAGES/DRESSINGS) ×2 IMPLANT
DRSG PAD ABDOMINAL 8X10 ST (GAUZE/BANDAGES/DRESSINGS) ×3 IMPLANT
DURAPREP 26ML APPLICATOR (WOUND CARE) ×4 IMPLANT
ELECT REM PT RETURN 15FT ADLT (MISCELLANEOUS) ×2 IMPLANT
GAUZE 4X4 16PLY ~~LOC~~+RFID DBL (SPONGE) IMPLANT
GLOVE SRG 8 PF TXTR STRL LF DI (GLOVE) ×1 IMPLANT
GLOVE SURG ENC MOIS LTX SZ6.5 (GLOVE) ×3 IMPLANT
GLOVE SURG ENC MOIS LTX SZ7.5 (GLOVE) ×3 IMPLANT
GLOVE SURG UNDER POLY LF SZ7 (GLOVE) ×2 IMPLANT
GLOVE SURG UNDER POLY LF SZ8 (GLOVE) ×1
GOWN STRL REUS W/ TWL LRG LVL3 (GOWN DISPOSABLE) ×2 IMPLANT
GOWN STRL REUS W/TWL LRG LVL3 (GOWN DISPOSABLE) ×4
HANDPIECE INTERPULSE COAX TIP (DISPOSABLE) ×1
HOLDER FOLEY CATH W/STRAP (MISCELLANEOUS) ×1 IMPLANT
HOOD PEEL AWAY FLYTE STAYCOOL (MISCELLANEOUS) ×6 IMPLANT
IMMOBILIZER KNEE 20 (SOFTGOODS) ×2
IMMOBILIZER KNEE 20 THIGH 36 (SOFTGOODS) ×1 IMPLANT
INSERT TIB FIX BEARNG SZ 5 5MM (Insert) ×1 IMPLANT
KIT TURNOVER KIT A (KITS) IMPLANT
MANIFOLD NEPTUNE II (INSTRUMENTS) ×2 IMPLANT
NS IRRIG 1000ML POUR BTL (IV SOLUTION) ×2 IMPLANT
PACK TOTAL KNEE CUSTOM (KITS) ×2 IMPLANT
PROTECTOR NERVE ULNAR (MISCELLANEOUS) ×2 IMPLANT
SET HNDPC FAN SPRY TIP SCT (DISPOSABLE) ×1 IMPLANT
SET PAD KNEE POSITIONER (MISCELLANEOUS) ×2 IMPLANT
STRIP CLOSURE SKIN 1/2X4 (GAUZE/BANDAGES/DRESSINGS) ×1 IMPLANT
SUT VIC AB 1 CT1 36 (SUTURE) ×4 IMPLANT
SUT VIC AB 2-0 CT1 27 (SUTURE) ×2
SUT VIC AB 2-0 CT1 TAPERPNT 27 (SUTURE) ×1 IMPLANT
SUT VIC AB 3-0 SH 8-18 (SUTURE) ×2 IMPLANT
TRAY FOLEY MTR SLVR 14FR STAT (SET/KITS/TRAYS/PACK) ×1 IMPLANT
TRAY FOLEY MTR SLVR 16FR STAT (SET/KITS/TRAYS/PACK) ×1 IMPLANT
TUBE SUCTION HIGH CAP CLEAR NV (SUCTIONS) ×2 IMPLANT
WATER STERILE IRR 1000ML POUR (IV SOLUTION) ×4 IMPLANT
WRAP KNEE MAXI GEL POST OP (GAUZE/BANDAGES/DRESSINGS) ×2 IMPLANT

## 2021-06-26 NOTE — Anesthesia Postprocedure Evaluation (Signed)
Anesthesia Post Note  Patient: Tammy Sutton  Procedure(s) Performed: TOTAL KNEE ARTHROPLASTY (Right: Knee)     Patient location during evaluation: PACU Anesthesia Type: Spinal Level of consciousness: awake, awake and alert and oriented Pain management: pain level controlled Vital Signs Assessment: post-procedure vital signs reviewed and stable Respiratory status: spontaneous breathing, nonlabored ventilation and respiratory function stable Cardiovascular status: blood pressure returned to baseline and stable Postop Assessment: no headache, no backache, spinal receding and no apparent nausea or vomiting Anesthetic complications: no   No notable events documented.  Last Vitals:  Vitals:   06/26/21 1600 06/26/21 1630  BP: (!) 147/84 (!) 161/83  Pulse: 89 91  Resp:    Temp:    SpO2: 100% 99%    Last Pain:  Vitals:   06/26/21 1700  TempSrc:   PainSc: 0-No pain                 Santa Lighter

## 2021-06-26 NOTE — Anesthesia Procedure Notes (Signed)
Anesthesia Regional Block: Adductor canal block   Pre-Anesthetic Checklist: , timeout performed,  Correct Patient, Correct Site, Correct Laterality,  Correct Procedure, Correct Position, site marked,  Risks and benefits discussed,  Surgical consent,  Pre-op evaluation,  At surgeon's request and post-op pain management  Laterality: Right  Prep: chloraprep       Needles:  Injection technique: Single-shot  Needle Type: Echogenic Needle     Needle Length: 9cm  Needle Gauge: 21     Additional Needles:   Procedures:,,,, ultrasound used (permanent image in chart),,    Narrative:  Start time: 06/26/2021 9:02 AM End time: 06/26/2021 9:07 AM Injection made incrementally with aspirations every 5 mL.  Performed by: Personally  Anesthesiologist: Santa Lighter, MD  Additional Notes: No pain on injection. No increased resistance to injection. Injection made in 5cc increments.  Good needle visualization.  Patient tolerated procedure well.

## 2021-06-26 NOTE — Anesthesia Procedure Notes (Signed)
Procedure Name: MAC Date/Time: 06/26/2021 10:43 AM Performed by: Maxwell Caul, CRNA Pre-anesthesia Checklist: Patient identified, Emergency Drugs available, Suction available and Patient being monitored Oxygen Delivery Method: Simple face mask

## 2021-06-26 NOTE — Anesthesia Preprocedure Evaluation (Signed)
Anesthesia Evaluation  Patient identified by MRN, date of birth, ID band Patient awake    Reviewed: Allergy & Precautions, NPO status , Patient's Chart, lab work & pertinent test results  History of Anesthesia Complications (+) PONV and history of anesthetic complications  Airway Mallampati: II  TM Distance: >3 FB Neck ROM: Full    Dental  (+) Teeth Intact, Dental Advisory Given   Pulmonary asthma ,    Pulmonary exam normal breath sounds clear to auscultation       Cardiovascular negative cardio ROS Normal cardiovascular exam Rhythm:Regular Rate:Normal     Neuro/Psych negative neurological ROS  negative psych ROS   GI/Hepatic negative GI ROS, Neg liver ROS,   Endo/Other  Sjogren's syndrome  Renal/GU negative Renal ROS     Musculoskeletal  (+) Arthritis  (djd right knee), Osteoarthritis,    Abdominal   Peds  Hematology  (+) Blood dyscrasia, anemia , Plt [id number]   Anesthesia Other Findings Day of surgery medications reviewed with the patient.  Reproductive/Obstetrics                             Anesthesia Physical Anesthesia Plan  ASA: 2  Anesthesia Plan: Spinal   Post-op Pain Management: Regional block   Induction: Intravenous  PONV Risk Score and Plan: 3 and Dexamethasone and Ondansetron  Airway Management Planned: Natural Airway and Simple Face Mask  Additional Equipment:   Intra-op Plan:   Post-operative Plan:   Informed Consent: I have reviewed the patients History and Physical, chart, labs and discussed the procedure including the risks, benefits and alternatives for the proposed anesthesia with the patient or authorized representative who has indicated his/her understanding and acceptance.     Dental advisory given  Plan Discussed with: CRNA  Anesthesia Plan Comments:         Anesthesia Quick Evaluation

## 2021-06-26 NOTE — Anesthesia Procedure Notes (Signed)
Spinal  Patient location during procedure: OR Start time: 06/26/2021 10:45 AM End time: 06/26/2021 10:54 AM Reason for block: surgical anesthesia Staffing Performed: anesthesiologist  Anesthesiologist: Santa Lighter, MD Preanesthetic Checklist Completed: patient identified, IV checked, risks and benefits discussed, surgical consent, monitors and equipment checked, pre-op evaluation and timeout performed Spinal Block Patient position: sitting Prep: DuraPrep and site prepped and draped Patient monitoring: continuous pulse ox and blood pressure Approach: midline Location: L3-4 Injection technique: single-shot Needle Needle type: Pencan  Needle gauge: 24 G Assessment Sensory level: T8 Events: CSF return and second provider Additional Notes Functioning IV was confirmed and monitors were applied. Sterile prep and drape, including hand hygiene, mask and sterile gloves were used. The patient was positioned and the spine was prepped. The skin was anesthetized with lidocaine.  Free flow of clear CSF was obtained prior to injecting local anesthetic into the CSF.  The spinal needle aspirated freely following injection.  The needle was carefully withdrawn.  The patient tolerated the procedure well. Consent was obtained prior to procedure with all questions answered and concerns addressed. Risks including but not limited to bleeding, infection, nerve damage, paralysis, failed block, inadequate analgesia, allergic reaction, high spinal, itching and headache were discussed and the patient wished to proceed.   Hoy Morn, MD

## 2021-06-26 NOTE — Progress Notes (Signed)
Assisted Dr. Turk with right, ultrasound guided, adductor canal block. Side rails up, monitors on throughout procedure. See vital signs in flow sheet. Tolerated Procedure well.  

## 2021-06-26 NOTE — Interval H&P Note (Signed)
History and Physical Interval Note:  06/26/2021 9:44 AM  Tammy Sutton  has presented today for surgery, with the diagnosis of djd right knee.  The various methods of treatment have been discussed with the patient and family. After consideration of risks, benefits and other options for treatment, the patient has consented to  Procedure(s): TOTAL KNEE ARTHROPLASTY (Right) as a surgical intervention.  The patient's history has been reviewed, patient examined, no change in status, stable for surgery.  I have reviewed the patient's chart and labs.  Questions were answered to the patient's satisfaction.     Johnny Bridge

## 2021-06-26 NOTE — Discharge Instructions (Signed)

## 2021-06-26 NOTE — Transfer of Care (Signed)
Immediate Anesthesia Transfer of Care Note  Patient: Tammy Sutton  Procedure(s) Performed: TOTAL KNEE ARTHROPLASTY (Right: Knee)  Patient Location: PACU  Anesthesia Type:Spinal  Level of Consciousness: awake, alert  and oriented  Airway & Oxygen Therapy: Patient Spontanous Breathing and Patient connected to face mask oxygen  Post-op Assessment: Report given to RN and Post -op Vital signs reviewed and stable  Post vital signs: Reviewed and stable  Last Vitals:  Vitals Value Taken Time  BP    Temp    Pulse 61 06/26/21 1318  Resp    SpO2 100 % 06/26/21 1318  Vitals shown include unvalidated device data.  Last Pain:  Vitals:   06/26/21 0905  TempSrc:   PainSc: 0-No pain         Complications: No notable events documented.

## 2021-06-26 NOTE — Op Note (Signed)
DATE OF SURGERY:  06/26/2021 TIME: 2:08 PM  PATIENT NAME:  Tammy Sutton   AGE: 76 y.o.    PRE-OPERATIVE DIAGNOSIS:  right knee valgus osteoarthritis  POST-OPERATIVE DIAGNOSIS:  Same  PROCEDURE:  RIGHT Total Knee Arthroplasty  SURGEON:  Johnny Bridge, MD   ASSISTANT:  Merlene Pulling, PA-C, present and scrubbed throughout the case, critical for assistance with exposure, retraction, instrumentation, and closure.   OPERATIVE IMPLANTS: Depuy Attune size 5 Posterior Stabilized Femur, with a size 5 Fixed Bearing Tibia, 5 Polyethylene insert with a 38 medialized oval dome polyethylene patella.  PREOPERATIVE INDICATIONS:  [name] is a 76 y.o. year old female with end stage bone on bone degenerative arthritis of the knee who failed conservative treatment, including injections, antiinflammatories, activity modification, and assistive devices, and had significant impairment of their activities of daily living, and elected for Total Knee Arthroplasty.   The risks, benefits, and alternatives were discussed at length including but not limited to the risks of infection, bleeding, nerve injury, stiffness, blood clots, the need for revision surgery, cardiopulmonary complications, among others, and they were willing to proceed.  OPERATIVE FINDINGS AND UNIQUE ASPECTS OF THE CASE: Access was excellent.  Bone quality was somewhat weak.  She was very delicate.  I was easily able to dislocate the tibia.  She had grade 4 chondral loss on the lateral femoral condyle posteriorly.  ESTIMATED BLOOD LOSS: 75 mL  OPERATIVE DESCRIPTION:  The patient was brought to the operative room and placed in a supine position.  Anesthesia was administered.  IV antibiotics were given.  The lower extremity was prepped and draped in the usual sterile fashion.  Time out was performed.  The leg was elevated and exsanguinated and the tourniquet was inflated.  Anterior quadriceps tendon splitting approach was performed.   The patella was everted and osteophytes were removed.  The anterior horn of the medial and lateral meniscus was removed.   The patella was then measured, and cut with the saw.  The thickness before the cut was 23 and after the cut was 14.  A metal shield was used to protect the patella throughout the case.    The distal femur was opened with the drill and the intramedullary distal femoral cutting jig was utilized, set at 5 degrees resecting 9 mm off the distal femur.  Care was taken to protect the collateral ligaments.  Then the extramedullary tibial cutting jig was utilized making the appropriate cut using the anterior tibial crest as a reference building in appropriate posterior slope.  Care was taken during the cut to protect the medial and collateral ligaments.  The proximal tibia was removed along with the posterior horns of the menisci.  The PCL was sacrificed.    The extensor gap was measured and found to have adequate resection, measuring to a size 5 after I cut the tibia a second time.    The distal femoral sizing jig was applied, taking care to avoid notching.  This was set at 3 degrees of external rotation.  Then the 4-in-1 cutting jig was applied and the anterior and posterior femur was cut, along with the chamfer cuts.  All posterior osteophytes were removed.  The flexion gap was then measured and was symmetric with the extension gap.  I completed the distal femoral preparation using the appropriate jig to prepare the box.  The proximal tibia sized and prepared accordingly with the reamer and the punch, and then all components were trialed with the  poly insert.  The knee was found to have excellent balance and full motion.    The above named components were then cemented into place and all excess cement was removed.  The real polyethylene implant was placed.  After the cement had cured I released the tourniquet and confirmed excellent hemostasis with no major posterior vessel injury.     The knee was easily taken through a range of motion and the patella tracked well and the knee irrigated copiously and the parapatellar and subcutaneous tissue closed with vicryl, and monocryl with steri strips for the skin.  The wounds were injected with marcaine, and dressed with sterile gauze and the patient was awakened and returned to the PACU in stable and satisfactory condition.  There were no complications.  Total tourniquet time was 70 minutes.

## 2021-06-26 NOTE — Evaluation (Signed)
Physical Therapy Evaluation Patient Details Name: Tammy Sutton MRN: [id number] DOB: 07-07-1945 Today's Date: 06/26/2021  History of Present Illness  Pt is 76 yo female admitted on 06/26/21 for R TKA.  Pt with hx including arthritis and asthma.  Clinical Impression  Pt is s/p TKA resulting in the deficits listed below (see PT Problem List). PT asked to evaluate for possible same day d/c. Pt is active and independent at baseline (enjoys going to her exercise class and mows yard).  She lives alone but has arranged for a friend to stay with her.  Pt does have a split level house and will have to deal with several steps regularly and some without true hand rails.  She does have DME including RW , toilet rise, and has borrowed a bsc.  Today, pt with good pain control and ROM, but with limited quad activation (light 1/5 on MMT).  She had full extensor lag and required KI to ambulate short distance.  Noted pt did have spinal and adductor canal block.  Considering knee buckling and stairs in pt's home - recommend pt not safe for same day d/c and will benefit from further therapy.  Notified RN. Pt will benefit from skilled PT to increase their independence and safety with mobility to allow discharge to the venue listed below.         Recommendations for follow up therapy are one component of a multi-disciplinary discharge planning process, led by the attending physician.  Recommendations may be updated based on patient status, additional functional criteria and insurance authorization.  Follow Up Recommendations Follow physician's recommendations for discharge plan and follow up therapies    Assistance Recommended at Discharge Frequent or constant Supervision/Assistance  Patient can return home with the following  A little help with walking and/or transfers;A little help with bathing/dressing/bathroom;Help with stairs or ramp for entrance;Assistance with cooking/housework    Equipment Recommendations  None recommended by PT  Recommendations for Other Services       Functional Status Assessment Patient has had a recent decline in their functional status and demonstrates the ability to make significant improvements in function in a reasonable and predictable amount of time.     Precautions / Restrictions Precautions Precautions: Fall Required Braces or Orthoses: Knee Immobilizer - Right Knee Immobilizer - Right: Discontinue once straight leg raise with < 10 degree lag Restrictions Weight Bearing Restrictions: Yes RLE Weight Bearing: Weight bearing as tolerated      Mobility  Bed Mobility Overal bed mobility: Needs Assistance Bed Mobility: Supine to Sit, Sit to Supine     Supine to sit: Min assist Sit to supine: Min assist   General bed mobility comments: min A for R LE    Transfers Overall transfer level: Needs assistance Equipment used: Rolling walker (2 wheels) Transfers: Sit to/from Stand Sit to Stand: Min guard           General transfer comment: KI in place, cues for R LE management    Ambulation/Gait Ambulation/Gait assistance: Min Web designer (Feet): 50 Feet Assistive device: Rolling walker (2 wheels) Gait Pattern/deviations: Step-to pattern, Decreased stance time - right, Decreased weight shift to right, Decreased stride length Gait velocity: decreased     General Gait Details: KI in place and pt cued for step to R gait; min A to steady and at times when too much weight shifted to R pt could feel knee buckling in KI but does well compensating with UE  Stairs  Wheelchair Mobility    Modified Rankin (Stroke Patients Only)       Balance Overall balance assessment: Needs assistance Sitting-balance support: No upper extremity supported Sitting balance-Leahy Scale: Good     Standing balance support: Bilateral upper extremity supported, Reliant on assistive device for balance Standing balance-Leahy Scale: Poor                                Pertinent Vitals/Pain Pain Assessment Pain Assessment: No/denies pain    Home Living Family/patient expects to be discharged to:: Private residence Living Arrangements: Alone Available Help at Discharge: Friend(s);Available 24 hours/day Type of Home: House Home Access: Stairs to enter   [employer] of Steps: 1 step outside then 4-5 steps from entry way to main level with rail on R Alternate Level Stairs-Number of Steps: 4-5 to main area (see above) ; 10+ to bed/bath no rail but chair rail on both sides and narrow Home Layout: Multi-level;1/2 bath on main level (split level with steps to living area (no bathroom) and more steps to bedroom/bathroom) Home Equipment: Shower seat;BSC/3in1;Toilet riser;Rolling Walker (2 wheels);Cane - single point      Prior Function Prior Level of Function : Independent/Modified Independent;Driving             Mobility Comments: could ambulate in community; liked to go to exercise class; could mow yard with push power       Hand Dominance        Extremity/Trunk Assessment   Upper Extremity Assessment Upper Extremity Assessment: Overall WFL for tasks assessed (some limitations L Shoulder ROM but WFL)    Lower Extremity Assessment Lower Extremity Assessment: LLE deficits/detail;RLE deficits/detail RLE Deficits / Details: ROM: knee 0 to90 degrees, hip and ankle WFL; MMT: ankle 4/5, knee light 1/5, hip 3/5; Full extensor lag RLE Sensation: decreased light touch LLE Deficits / Details: ROM WFL; MMT 5/5 LLE Sensation: WNL    Cervical / Trunk Assessment Cervical / Trunk Assessment: Normal  Communication   Communication: No difficulties  Cognition Arousal/Alertness: Awake/alert Behavior During Therapy: WFL for tasks assessed/performed Overall Cognitive Status: Within Functional Limits for tasks assessed                                          General Comments General comments  (skin integrity, edema, etc.): Discussed not safe from PT standpoint to return home due to knee buckling and stairs.  Pt disappointed but agrees, also says she is not suprised as meds typically take a while for her to wear off    Exercises     Assessment/Plan    PT Assessment Patient needs continued PT services  PT Problem List Decreased strength;Decreased mobility;Decreased balance;Decreased knowledge of use of DME;Pain;Decreased activity tolerance;Decreased range of motion;Decreased knowledge of precautions       PT Treatment Interventions DME instruction;Therapeutic activities;Modalities;Gait training;Therapeutic exercise;Patient/family education;Stair training;Functional mobility training;Balance training    PT Goals (Current goals can be found in the Care Plan section)  Acute Rehab PT Goals Patient Stated Goal: return home and back to exercise class PT Goal Formulation: With patient Time For Goal Achievement: 07/10/21 Potential to Achieve Goals: Good    Frequency 7X/week     Co-evaluation               AM-PAC PT "6 Clicks" Mobility  Outcome Measure Help needed  turning from your back to your side while in a flat bed without using bedrails?: A Little Help needed moving from lying on your back to sitting on the side of a flat bed without using bedrails?: A Little Help needed moving to and from a bed to a chair (including a wheelchair)?: A Little Help needed standing up from a chair using your arms (e.g., wheelchair or bedside chair)?: A Little Help needed to walk in hospital room?: A Little Help needed climbing 3-5 steps with a railing? : Total 6 Click Score: 16    End of Session Equipment Utilized During Treatment: Gait belt;Right knee immobilizer Activity Tolerance: Patient tolerated treatment well Patient left: in bed;with call bell/phone within reach;with family/visitor present (PACU stretcher) Nurse Communication: Mobility status;Other (comment) (pt did not  "pass" PT and needs to stay) PT Visit Diagnosis: Other abnormalities of gait and mobility (R26.89);Muscle weakness (generalized) (M62.81)    Time: 9826-4158 PT Time Calculation (min) (ACUTE ONLY): 25 min   Charges:   PT Evaluation $PT Eval Low Complexity: 1 Low PT Treatments $Gait Training: 8-22 mins        Abran Richard, PT Acute Rehab Services Pager [phone] [facility] 06/26/2021, 5:41 PM

## 2021-06-27 ENCOUNTER — Other Ambulatory Visit: Payer: Self-pay

## 2021-06-27 ENCOUNTER — Encounter (HOSPITAL_COMMUNITY): Payer: Self-pay | Admitting: Orthopedic Surgery

## 2021-06-27 DIAGNOSIS — M1711 Unilateral primary osteoarthritis, right knee: Secondary | ICD-10-CM | POA: Diagnosis not present

## 2021-06-27 LAB — BASIC METABOLIC PANEL
Anion gap: 6 (ref 5–15)
BUN: 24 mg/dL — ABNORMAL HIGH (ref 8–23)
CO2: 25 mmol/L (ref 22–32)
Calcium: 8.3 mg/dL — ABNORMAL LOW (ref 8.9–10.3)
Chloride: 100 mmol/L (ref 98–111)
Creatinine, Ser: 1.04 mg/dL — ABNORMAL HIGH (ref 0.44–1.00)
GFR, Estimated: 56 mL/min — ABNORMAL LOW (ref >60)
Glucose, Bld: 109 mg/dL — ABNORMAL HIGH (ref 70–99)
Potassium: 3.8 mmol/L (ref 3.5–5.1)
Sodium: 131 mmol/L — ABNORMAL LOW (ref 135–145)

## 2021-06-27 LAB — CBC
HCT: 27 % — ABNORMAL LOW (ref 36.0–46.0)
Hemoglobin: 8.9 g/dL — ABNORMAL LOW (ref 12.0–15.0)
MCH: 28.3 pg (ref 26.0–34.0)
MCHC: 33 g/dL (ref 30.0–36.0)
MCV: 85.7 fL (ref 80.0–100.0)
Platelets: 203 10*3/uL (ref 150–400)
RBC: 3.15 MIL/uL — ABNORMAL LOW (ref 3.87–5.11)
RDW: 14.7 % (ref 11.5–15.5)
WBC: 4.4 10*3/uL (ref 4.0–10.5)
nRBC: 0 % (ref 0.0–0.2)

## 2021-06-27 MED ORDER — TRAMADOL HCL 50 MG PO TABS: 50.0000 mg | ORAL_TABLET | Freq: Four times a day (QID) | ORAL | Status: DC | PRN

## 2021-06-27 NOTE — Plan of Care (Signed)
Discharge instructions given to the patient including medications. ?

## 2021-06-27 NOTE — Plan of Care (Signed)
  Problem: Activity: Goal: Ability to avoid complications of mobility impairment will improve Outcome: Progressing   Problem: Clinical Measurements: Goal: Postoperative complications will be avoided or minimized Outcome: Progressing   Problem: Pain Management: Goal: Pain level will decrease with appropriate interventions Outcome: Progressing   

## 2021-06-27 NOTE — TOC Transition Note (Signed)
Transition of Care [facility]) - CM/SW Discharge Note  Patient Details  Name: Tammy Sutton MRN: [id number] Date of Birth: Dec 07, 1945  [facility]) CM/SW Contact:  Sherie Don, LCSW Phone Number: 06/27/2021, 9:35 AM  Clinical Narrative: Patient is expected to discharge home after working with PT. CSW met with patient to review discharge plan. Patient will discharge home with OPPT at [facility] in [place]. Patient has a rolling walker, cane, BSC, and tub bench at home so there are no DME needs at this time. TOC signing off.  Final next level of care: OP Rehab Barriers to Discharge: No Barriers Identified  Patient Goals and CMS Choice Patient states their goals for this hospitalization and ongoing recovery are:: Discharge home with OPPT at [facility] in [place] offered to / list presented to : NA  Discharge Plan and Services        DME Arranged: N/A DME Agency: NA  Readmission Risk Interventions No flowsheet data found.

## 2021-06-27 NOTE — Discharge Summary (Signed)
Discharge Summary  Patient ID: Tammy Sutton MRN: [id number] DOB/AGE: 02-04-1946 76 y.o.  Admit date: 06/26/2021 Discharge date: 06/27/2021  Admission Diagnoses:  Osteoarthritis of right knee  Discharge Diagnoses:  Principal Problem:   Osteoarthritis of right knee Active Problems:   S/P TKR (total knee replacement), right   Past Medical History:  Diagnosis Date   Arthritis    Asthma    PONV (postoperative nausea and vomiting)    Sjogren's syndrome ([place])     Surgeries: Procedure(s): TOTAL KNEE ARTHROPLASTY on 06/26/2021   Consultants (if any):   Discharged Condition: Improved  Hospital Course: Tammy Sutton is an 76 y.o. female who was admitted 06/26/2021 with a diagnosis of Osteoarthritis of right knee and went to the operating room on 06/26/2021 and underwent the above named procedures.    She was given perioperative antibiotics:  Anti-infectives (From admission, onward)    Start     Dose/Rate Route Frequency Ordered Stop   06/26/21 1700  ceFAZolin (ANCEF) IVPB 2g/100 mL premix        2 g 200 mL/hr over 30 Minutes Intravenous Every 6 hours 06/26/21 1436 06/27/21 0459   06/26/21 1521  ceFAZolin (ANCEF) 2-4 GM/100ML-% IVPB       Note to Pharmacy: Hassie Bruce A: cabinet override      06/26/21 1521 06/26/21 1632   06/26/21 0800  ceFAZolin (ANCEF) IVPB 2g/100 mL premix        2 g 200 mL/hr over 30 Minutes Intravenous On call to O.R. 06/26/21 9449 06/26/21 1054     .  She was given sequential compression devices, early ambulation, and aspirin for DVT prophylaxis.  She benefited maximally from the hospital stay and there were no complications.    Recent vital signs:  Vitals:   06/27/21 0148 06/27/21 0524  BP: (!) 147/83 (!) 151/88  Pulse: 65 75  Resp: 16 16  Temp: 98.6 F (37 C) 99 F (37.2 C)  SpO2: 99% 96%    Recent laboratory studies:  Lab Results  Component Value Date   HGB 8.9 (L) 06/27/2021   HGB 10.0 (L) 06/26/2021   HGB 10.7 (L)  06/15/2021   Lab Results  Component Value Date   WBC 4.4 06/27/2021   PLT 203 06/27/2021   No results found for: INR Lab Results  Component Value Date   NA 131 (L) 06/27/2021   K 3.8 06/27/2021   CL 100 06/27/2021   CO2 25 06/27/2021   BUN 24 (H) 06/27/2021   CREATININE 1.04 (H) 06/27/2021   GLUCOSE 109 (H) 06/27/2021    Discharge Medications:   Allergies as of 06/27/2021       Reactions   Clindamycin/lincomycin Itching   Sulfamethoxazole-trimethoprim Itching   Bactrim         Medication List     TAKE these medications    albuterol 108 (90 Base) MCG/ACT inhaler Commonly known as: VENTOLIN HFA Inhale 2 puffs into the lungs every 6 (six) hours as needed for wheezing or shortness of breath.   aspirin EC 81 MG tablet Take 1 tablet (81 mg total) by mouth 2 (two) times daily. For DVT prophylaxis for 30 days after surgery.   oxyCODONE 5 MG immediate release tablet Commonly known as: Roxicodone Take 1 tablet (5 mg total) by mouth every 4 (four) hours as needed for severe pain.   Restasis 0.05 % ophthalmic emulsion Generic drug: cycloSPORINE Place 1 drop into both eyes daily.   sennosides-docusate sodium 8.6-50 MG tablet Commonly known  as: SENOKOT-S Take 2 tablets by mouth daily.        Diagnostic Studies: DG Knee Right Port  Result Date: 06/26/2021 CLINICAL DATA:  Right knee arthroplasty EXAM: PORTABLE RIGHT KNEE - 1-2 VIEW COMPARISON:  None. FINDINGS: There is evidence of recent right knee arthroplasty. There are pockets of air in the soft tissues. No fracture or dislocation is seen. IMPRESSION: Status post right knee arthroplasty. Electronically Signed   By: Elmer Picker M.D.   On: 06/26/2021 14:40    Disposition: Discharge disposition: 01-Home or Self Care          Follow-up Information     Marchia Bond, MD. Schedule an appointment as soon as possible for a visit in 2 week(s).   Specialty: Orthopedic Surgery Contact information: [place] [address] [phone]                  Signed: Jola Baptist 06/27/2021, 10:33 AM

## 2021-06-27 NOTE — Progress Notes (Signed)
Subjective: 1 Day Post-Op s/p Procedure(s): TOTAL KNEE ARTHROPLASTY   Patient is alert, oriented. Feeling a little lightheaded this morning, feels like its because she hasn't eaten much. Reports pain as well controlled. Denies chest pain, SOB, Calf pain. No nausea/vomiting. No other complaints.     Objective:  PE: VITALS:   Vitals:   06/26/21 1907 06/26/21 2027 06/27/21 0148 06/27/21 0524  BP: (!) 155/90 (!) 149/77 (!) 147/83 (!) 151/88  Pulse: 76 87 65 75  Resp:  17 16 16   Temp: 99.2 F (37.3 C) 99.2 F (37.3 C) 98.6 F (37 C) 99 F (37.2 C)  TempSrc:      SpO2: 97% 98% 99% 96%  Weight:      Height:       General: laying in bed in no acute distress ABD soft Sensation intact distally Intact pulses distally Dorsiflexion/Plantar flexion intact Incision: dressing C/D/I  LABS  Results for orders placed or performed during the hospital encounter of 06/26/21 (from the past 24 hour(s))  Hemoglobin and hematocrit, blood     Status: Abnormal   Collection Time: 06/26/21  2:56 PM  Result Value Ref Range   Hemoglobin 10.0 (L) 12.0 - 15.0 g/dL   HCT 30.6 (L) 36.0 - 46.0 %  CBC     Status: Abnormal   Collection Time: 06/27/21  3:24 AM  Result Value Ref Range   WBC 4.4 4.0 - 10.5 K/uL   RBC 3.15 (L) 3.87 - 5.11 MIL/uL   Hemoglobin 8.9 (L) 12.0 - 15.0 g/dL   HCT 27.0 (L) 36.0 - 46.0 %   MCV 85.7 80.0 - 100.0 fL   MCH 28.3 26.0 - 34.0 pg   MCHC 33.0 30.0 - 36.0 g/dL   RDW 14.7 11.5 - 15.5 %   Platelets 203 150 - 400 K/uL   nRBC 0.0 0.0 - 0.2 %  Basic metabolic panel     Status: Abnormal   Collection Time: 06/27/21  3:24 AM  Result Value Ref Range   Sodium 131 (L) 135 - 145 mmol/L   Potassium 3.8 3.5 - 5.1 mmol/L   Chloride 100 98 - 111 mmol/L   CO2 25 22 - 32 mmol/L   Glucose, Bld 109 (H) 70 - 99 mg/dL   BUN 24 (H) 8 - 23 mg/dL   Creatinine, Ser 1.04 (H) 0.44 - 1.00 mg/dL   Calcium 8.3 (L) 8.9 - 10.3 mg/dL   GFR, Estimated 56 (L) >60 mL/min   Anion gap 6 5 - 15     DG Knee Right Port  Result Date: 06/26/2021 CLINICAL DATA:  Right knee arthroplasty EXAM: PORTABLE RIGHT KNEE - 1-2 VIEW COMPARISON:  None. FINDINGS: There is evidence of recent right knee arthroplasty. There are pockets of air in the soft tissues. No fracture or dislocation is seen. IMPRESSION: Status post right knee arthroplasty. Electronically Signed   By: Elmer Picker M.D.   On: 06/26/2021 14:40    Assessment/Plan: Principal Problem:   Osteoarthritis of right knee Active Problems:   S/P TKR (total knee replacement), right  1 Day Post-Op s/p Procedure(s): TOTAL KNEE ARTHROPLASTY  Acute blood loss anemia: Hbg dropped to 8.9 this morning. Will continue to watch.  Weightbearing: WBAT RLE Insicional and dressing care: Reinforce dressings as needed VTE prophylaxis: Aspirin 81mg  BID x 30 days Pain control: Patient states that narcotics make her feel very loopy, I will d/c oxycodone. She will have tylenol for mild-moderate and tramadol for severe pain Follow - up  plan: 2 weeks with Dr. Mardelle Matte Dispo: pending further progress with PT today. We will make sure she no longer is feeling dizziness when up and walking.  Contact information:   Weekdays 8-5 [address], [place] [phone] A fter hours and holidays please check [email or web address] for group call information for [facility]  Ventura Bruns 06/27/2021, 8:35 AM

## 2021-06-27 NOTE — Progress Notes (Signed)
Physical Therapy Treatment Patient Details Name: Tammy Sutton MRN: [id number] DOB: [birth year] Today's Date: 06/27/2021   History of Present Illness Pt is 76 yo female admitted on 06/26/21 for R TKA.  Pt with hx including arthritis and asthma.    PT Comments    Pt is POD # 1 and is progressing well.  Pt with much improved quad activation today and able to SLR without extensor lag so KI discontinued.  She demonstrated excellent ROM and quad control.  Pt ambulating 100' and performed stairs multiple methods to simulate home environment.  Pt demonstrates safe gait & transfers in order to return home from PT perspective once discharged by MD.  While in hospital, will continue to benefit from PT for skilled therapy to advance mobility and exercises.      Recommendations for follow up therapy are one component of a multi-disciplinary discharge planning process, led by the attending physician.  Recommendations may be updated based on patient status, additional functional criteria and insurance authorization.  Follow Up Recommendations  Follow physician's recommendations for discharge plan and follow up therapies     Assistance Recommended at Discharge Frequent or constant Supervision/Assistance  Patient can return home with the following A little help with walking and/or transfers;A little help with bathing/dressing/bathroom;Help with stairs or ramp for entrance;Assistance with cooking/housework   Equipment Recommendations  None recommended by PT    Recommendations for Other Services       Precautions / Restrictions Precautions Precautions: Fall Required Braces or Orthoses: Knee Immobilizer - Right Knee Immobilizer - Right: Discontinue once straight leg raise with < 10 degree lag Restrictions RLE Weight Bearing: Weight bearing as tolerated     Mobility  Bed Mobility Overal bed mobility: Needs Assistance Bed Mobility: Supine to Sit     Supine to sit: Supervision           Transfers Overall transfer level: Needs assistance Equipment used: Rolling walker (2 wheels) Transfers: Sit to/from Stand Sit to Stand: Min guard           General transfer comment: Cues for hand placement    Ambulation/Gait Ambulation/Gait assistance: Min guard Gait Distance (Feet): 100 Feet Assistive device: Rolling walker (2 wheels) Gait Pattern/deviations: Step-to pattern, Decreased stride length, Decreased weight shift to right Gait velocity: decreased     General Gait Details: Able to SLR with no extensor lag so did not utilize KI.  Pt ambulated well with good stability in R knee with WBAT.  Min cues for RW proximity   Stairs Stairs: Yes Stairs assistance: Min guard   Number of Stairs: 10 General stair comments: Performed multiple methods with cues: Started with bil rails and progressed to 1 rail on R going up.  Then performed 5 steps sideways with rail on R.  Pt stable with all   Wheelchair Mobility    Modified Rankin (Stroke Patients Only)       Balance Overall balance assessment: Needs assistance Sitting-balance support: No upper extremity supported Sitting balance-Leahy Scale: Good     Standing balance support: Bilateral upper extremity supported, Reliant on assistive device for balance Standing balance-Leahy Scale: Fair Standing balance comment: RW to ambulaet but could static stand no AD                            Cognition Arousal/Alertness: Awake/alert Behavior During Therapy: WFL for tasks assessed/performed Overall Cognitive Status: Within Functional Limits for tasks assessed  Exercises Total Joint Exercises Ankle Circles/Pumps: AROM, Both, 10 reps, Supine Quad Sets: AROM, Both, 10 reps, Supine Heel Slides: AROM, AAROM, Right, 10 reps, Supine (educated and utilized gait belt for AAROM for improved ROM) Hip ABduction/ADduction: AAROM, Right, 10 reps, Supine (educated on  gait belt for AAROM) Long Arc Quad: AROM, 10 reps, Right, Seated Knee Flexion: AROM, Right, 10 reps, Seated Goniometric ROM: R knee 3 to 85 degrees; able to SLR without ext lag    General Comments General comments (skin integrity, edema, etc.): VSS Educated on safe ice use, no pivots, car transfers, resting with leg straight, and TED hose during day. Also, encouraged walking every 1-2 hours during day. Educated on HEP with focus on mobility the first weeks. Discussed doing exercises within pain control and if pain increasing could decreased ROM, reps, and stop exercises as needed. Encouraged to perform quad sets and ankle pumps frequently for blood flow and to promote full knee extension.      Pertinent Vitals/Pain Pain Assessment Pain Assessment: 0-10 Pain Score: 2  Pain Location: R knee Pain Descriptors / Indicators: Discomfort Pain Intervention(s): Limited activity within patient's tolerance, Monitored during session    Home Living                          Prior Function            PT Goals (current goals can now be found in the care plan section) Progress towards PT goals: Progressing toward goals    Frequency    7X/week      PT Plan Current plan remains appropriate (pt wanting to know ~time of d/c to call her ride who is 1 hr away)    Co-evaluation              AM-PAC PT "6 Clicks" Mobility   Outcome Measure  Help needed turning from your back to your side while in a flat bed without using bedrails?: None Help needed moving from lying on your back to sitting on the side of a flat bed without using bedrails?: A Little Help needed moving to and from a bed to a chair (including a wheelchair)?: A Little Help needed standing up from a chair using your arms (e.g., wheelchair or bedside chair)?: A Little Help needed to walk in hospital room?: A Little Help needed climbing 3-5 steps with a railing? : A Little 6 Click Score: 19    End of Session  Equipment Utilized During Treatment: Gait belt Activity Tolerance: Patient tolerated treatment well Patient left: with chair alarm set;in chair;with call bell/phone within reach Nurse Communication: Mobility status PT Visit Diagnosis: Other abnormalities of gait and mobility (R26.89);Muscle weakness (generalized) (M62.81)     Time: 1010-1044 PT Time Calculation (min) (ACUTE ONLY): 34 min  Charges:  $Gait Training: 8-22 mins $Therapeutic Exercise: 8-22 mins                     Abran Richard, PT Acute Rehab Services Pager [phone] [facility] [place] 06/27/2021, 11:33 AM

## 2021-07-04 ENCOUNTER — Other Ambulatory Visit (HOSPITAL_COMMUNITY): Payer: PPO

## 2021-11-01 LAB — COLOGUARD: COLOGUARD: NEGATIVE

## 2021-11-01 LAB — EXTERNAL GENERIC LAB PROCEDURE: COLOGUARD: NEGATIVE

## 2022-03-08 NOTE — Patient Instructions (Signed)
DUE TO COVID-19 ONLY TWO VISITORS  (aged 77 and older)  ARE ALLOWED TO COME WITH YOU AND STAY IN THE WAITING ROOM ONLY DURING PRE OP AND PROCEDURE.   **NO VISITORS ARE ALLOWED IN THE SHORT STAY AREA OR RECOVERY ROOM!!**  IF YOU WILL BE ADMITTED INTO THE HOSPITAL YOU ARE ALLOWED ONLY FOUR SUPPORT PEOPLE DURING VISITATION HOURS ONLY (7 AM -8PM)   The support person(s) must pass our screening, gel in and out, and wear a mask at all times, including in the patient's room. Patients must also wear a mask when staff or their support person are in the room. Visitors GUEST BADGE MUST BE WORN VISIBLY  One adult visitor may remain with you overnight and MUST be in the room by 8 P.M.     Your procedure is scheduled on: 03/26/22   Report to [facility] Main Entrance    Report to admitting at   8:00 AM   Call this number if you have problems the morning of surgery [phone]   Do not eat food :After Midnight.   After Midnight you may have the following liquids until _7:30 _____ AM/ DAY OF SURGERY  Water Black Coffee (sugar ok, NO MILK/CREAM OR CREAMERS)  Tea (sugar ok, NO MILK/CREAM OR CREAMERS) regular and decaf                             Plain Jell-O (NO RED)                                           Fruit ices (not with fruit pulp, NO RED)                                     Popsicles (NO RED)                                                                  Juice: apple, WHITE grape, WHITE cranberry Sports drinks like Gatorade (NO RED)                     The day of surgery:  Drink ONE (1) Pre-Surgery Clear Ensure at  7:15 AM the morning of surgery. Drink in one sitting. Do not sip.  This drink was given to you during your hospital  pre-op appointment visit. Nothing else to drink after completing the  Pre-Surgery Clear Ensure 7:30 AM          If you have questions, please contact your surgeon's office.       Oral Hygiene is also important to reduce your risk of  infection.                                    Remember - BRUSH YOUR TEETH THE MORNING OF SURGERY WITH YOUR REGULAR TOOTHPASTE   Do NOT smoke after Midnight   Take these medicines the morning of surgery with A SIP OF WATER: none  Use yourr inhaler and bring it with you   Bring CPAP mask and tubing day of surgery.                              You may not have any metal on your body including hair pins, jewelry, and body piercing             Do not wear make-up, lotions, powders, perfumes/cologne, or deodorant  Do not wear nail polish including gel and S&S, artificial/acrylic nails, or any other type of covering on natural nails including finger and toenails. If you have artificial nails, gel coating, etc. that needs to be removed by a nail salon please have this removed prior to surgery or surgery may need to be canceled/ delayed if the surgeon/ anesthesia feels like they are unable to be safely monitored.   Do not shave  48 hours prior to surgery.  .   Do not bring valuables to the hospital. [place].   Contacts, dentures or bridgework may not be worn into surgery.   Bring small overnight bag day of surgery.   DO NOT [place].    Patients discharged on the day of surgery will not be allowed to drive home.  Someone NEEDS to stay with you for the first 24 hours after anesthesia.   Special Instructions: Bring a copy of your healthcare power of attorney and living will documents  the day of surgery if you haven't scanned them before.              Please read over the following fact sheets you were given: IF YOU HAVE QUESTIONS ABOUT YOUR PRE-OP INSTRUCTIONS PLEASE CALL [place]- Preparing for Total Shoulder Arthroplasty    Before surgery, you can play an important role. Because skin is not sterile, your skin needs to be as free of germs as possible. You can  reduce the number of germs on your skin by using the following products. Benzoyl Peroxide Gel Reduces the number of germs present on the skin Applied twice a day to shoulder area starting two days before surgery    ==================================================================  Please follow these instructions carefully:  BENZOYL PEROXIDE 5% GEL  Please do not use if you have an allergy to benzoyl peroxide.   If your skin becomes reddened/irritated stop using the benzoyl peroxide.  Starting two days before surgery, apply as follows: Apply benzoyl peroxide in the morning and at night. Apply after taking a shower. If you are not taking a shower clean entire shoulder front, back, and side along with the armpit with a clean wet washcloth.  Place a quarter-sized dollop on your shoulder and rub in thoroughly, making sure to cover the front, back, and side of your shoulder, along with the armpit.   2 days before ____ AM   ____ PM              1 day before ____ AM   ____ PM                         Do this twice a day for two days.  (Last application is the night before surgery, AFTER using the CHG soap as described below).  Do NOT apply benzoyl peroxide gel on  the day of surgery.   [facility] - Preparing for Surgery Before surgery, you can play an important role.  Because skin is not sterile, your skin needs to be as free of germs as possible.  You can reduce the number of germs on your skin by washing with CHG (chlorahexidine gluconate) soap before surgery.  CHG is an antiseptic cleaner which kills germs and bonds with the skin to continue killing germs even after washing. Please DO NOT use if you have an allergy to CHG or antibacterial soaps.  If your skin becomes reddened/irritated stop using the CHG and inform your nurse when you arrive at Short Stay. Do not shave (including legs and underarms) for at least 48 hours prior to the first CHG shower.   Please follow these instructions  carefully:  1.  Shower with CHG Soap the night before surgery and the  morning of Surgery.  2.  If you choose to wash your hair, wash your hair first as usual with your  normal  shampoo.  3.  After you shampoo, rinse your hair and body thoroughly to remove the  shampoo.                            4.  Use CHG as you would any other liquid soap.  You can apply chg directly  to the skin and wash                       Gently with a scrungie or clean washcloth.  5.  Apply the CHG Soap to your body ONLY FROM THE NECK DOWN.   Do not use on face/ open                           Wound or open sores. Avoid contact with eyes, ears mouth and genitals (private parts).                       Wash face,  Genitals (private parts) with your normal soap.             6.  Wash thoroughly, paying special attention to the area where your surgery  will be performed.  7.  Thoroughly rinse your body with warm water from the neck down.  8.  DO NOT shower/wash with your normal soap after using and rinsing off  the CHG Soap.                9.  Pat yourself dry with a clean towel.            10.  Wear clean pajamas.            11.  Place clean sheets on your bed the night of your first shower and do not  sleep with pets. Day of Surgery : Do not apply any lotions/deodorants the morning of surgery.  Please wear clean clothes to the hospital/surgery center.  FAILURE TO FOLLOW THESE INSTRUCTIONS MAY RESULT IN THE CANCELLATION OF YOUR SURGERY _  ________________________________________________________________________

## 2022-03-14 ENCOUNTER — Encounter (HOSPITAL_COMMUNITY): Payer: Self-pay

## 2022-03-14 ENCOUNTER — Other Ambulatory Visit: Payer: Self-pay

## 2022-03-14 ENCOUNTER — Encounter (HOSPITAL_COMMUNITY)
Admission: RE | Admit: 2022-03-14 | Discharge: 2022-03-14 | Disposition: A | Discharge status: Home / Self Care | Payer: PPO | Source: Ambulatory Visit | Attending: Orthopedic Surgery | Admitting: Orthopedic Surgery

## 2022-03-14 DIAGNOSIS — Z01812 Encounter for preprocedural laboratory examination: Secondary | ICD-10-CM | POA: Diagnosis present

## 2022-03-14 DIAGNOSIS — Z01818 Encounter for other preprocedural examination: Secondary | ICD-10-CM

## 2022-03-14 LAB — CBC
HCT: 32.1 % — ABNORMAL LOW (ref 36.0–46.0)
Hemoglobin: 10.4 g/dL — ABNORMAL LOW (ref 12.0–15.0)
MCH: 28.3 pg (ref 26.0–34.0)
MCHC: 32.4 g/dL (ref 30.0–36.0)
MCV: 87.2 fL (ref 80.0–100.0)
Platelets: 265 10*3/uL (ref 150–400)
RBC: 3.68 MIL/uL — ABNORMAL LOW (ref 3.87–5.11)
RDW: 14.6 % (ref 11.5–15.5)
WBC: 4.9 10*3/uL (ref 4.0–10.5)
nRBC: 0 % (ref 0.0–0.2)

## 2022-03-14 LAB — SURGICAL PCR SCREEN
MRSA, PCR: NEGATIVE
Staphylococcus aureus: POSITIVE — AB

## 2022-03-14 LAB — BASIC METABOLIC PANEL
Anion gap: 8 (ref 5–15)
BUN: 37 mg/dL — ABNORMAL HIGH (ref 8–23)
CO2: 25 mmol/L (ref 22–32)
Calcium: 9.2 mg/dL (ref 8.9–10.3)
Chloride: 101 mmol/L (ref 98–111)
Creatinine, Ser: 1.16 mg/dL — ABNORMAL HIGH (ref 0.44–1.00)
GFR, Estimated: 49 mL/min — ABNORMAL LOW (ref >60)
Glucose, Bld: 95 mg/dL (ref 70–99)
Potassium: 4.4 mmol/L (ref 3.5–5.1)
Sodium: 134 mmol/L — ABNORMAL LOW (ref 135–145)

## 2022-03-14 NOTE — Progress Notes (Addendum)
Anesthesia note:  Bowel prep reminder:NA  PCP - Dr. Bonney Leitz clearance on chart Cardiologist -none Other-   Chest x-ray - no EKG - no Stress Test - 03/12/22-epic ECHO - no Cardiac Cath - no  Pacemaker/ICD device last checked:NA  Sleep Study - no CPAP -   Pt is pre diabetic-NA Fasting Blood Sugar -  Checks Blood Sugar _____  Blood Thinner:ASA 81 mg Blood Thinner Instructions: Aspirin Instructions:none Last Dose:  Anesthesia review: yes H&H 10.4 and 32.1  Patient denies shortness of breath, fever, cough and chest pain at PAT appointment Pt has no SOB with activities  Patient verbalized understanding of instructions that were given to them at the PAT appointment. Patient was also instructed that they will need to review over the PAT instructions again at home before surgery. yes

## 2022-03-18 NOTE — H&P (Signed)
SHOULDER ARTHROPLASTY ADMISSION H&P  Patient ID: Tammy Sutton MRN: [id number] DOB/AGE: 07-22-1945 76 y.o.  Chief Complaint: right shoulder pain.  Planned Procedure Date: 03/26/22 Medical Clearance by Dr. Pleas Koch   HPI: Tammy Sutton is a 76 y.o. female who presents for evaluation of right shoulder rotator cuff arthropathy. The patient has a history of pain and functional disability in the right shoulder due to trauma and has failed non-surgical conservative treatments for greater than 12 weeks to include NSAID's and/or analgesics, corticosteriod injections, supervised PT with diminished ADL's post treatment, and activity modification.  Onset of symptoms was abrupt, starting a few months ago with rapidlly worsening course since that time. The patient noted no past surgery on the right shoulder.  Patient currently rates pain at 5 out of 10 with activity. Patient has worsening of pain with activity and weight bearing.  Patient has evidence of rotator cuff arthropathy by imaging studies.  There is no active infection.  Past Medical History:  Diagnosis Date   Arthritis    Asthma    PONV (postoperative nausea and vomiting)    Sjogren's syndrome ([place])    Past Surgical History:  Procedure Laterality Date   BREAST BIOPSY Left 2003   CATARACT EXTRACTION W/ INTRAOCULAR LENS IMPLANT Bilateral 2019   COLONOSCOPY     Dental implant     TOTAL KNEE ARTHROPLASTY Right 06/26/2021   Procedure: TOTAL KNEE ARTHROPLASTY;  Surgeon: Marchia Bond, MD;  Location: [facility] ORS;  Service: Orthopedics;  Laterality: Right;   TUBAL LIGATION  1983   Allergies  Allergen Reactions   Clindamycin/Lincomycin Itching   Sulfamethoxazole-Trimethoprim Itching    Bactrim    Prior to Admission medications   Medication Sig Start Date End Date Taking? Authorizing Provider  albuterol (VENTOLIN HFA) 108 (90 Base) MCG/ACT inhaler Inhale 2 puffs into the lungs every 6 (six) hours as needed for wheezing or shortness of  breath.   Yes [provider]  Multiple Vitamins-Minerals (ZINC PO) Take 1 tablet by mouth daily.   Yes [provider]  RESTASIS 0.05 % ophthalmic emulsion Place 1 drop into both eyes 2 (two) times daily. 02/02/21  Yes [provider]  TURMERIC PO Take 1 capsule by mouth daily.   Yes [provider]  aspirin EC 81 MG tablet Take 1 tablet (81 mg total) by mouth 2 (two) times daily. For DVT prophylaxis for 30 days after surgery. Patient not taking: Reported on 03/12/2022 06/26/21   Ventura Bruns, PA-C  oxyCODONE (ROXICODONE) 5 MG immediate release tablet Take 1 tablet (5 mg total) by mouth every 4 (four) hours as needed for severe pain. Patient not taking: Reported on 03/12/2022 06/26/21   Merlene Pulling K, PA-C  sennosides-docusate sodium (SENOKOT-S) 8.6-50 MG tablet Take 2 tablets by mouth daily. Patient not taking: Reported on 03/12/2022 06/26/21   Ventura Bruns, PA-C   Social History   Socioeconomic History   Marital status: Divorced    Spouse name: Not on file   Number of children: Not on file   Years of education: Not on file   Highest education level: Not on file  Occupational History   Not on file  Tobacco Use   Smoking status: Never   Smokeless tobacco: Never  Vaping Use   Vaping Use: Never used  Substance and Sexual Activity   Alcohol use: Yes    Comment: Rare   Drug use: Never   Sexual activity: Not on file  Other Topics Concern   Not  on file  Social History Narrative   Not on file   Social Determinants of Health   Financial Resource Strain: Not on file  Food Insecurity: Not on file  Transportation Needs: Not on file  Physical Activity: Not on file  Stress: Not on file  Social Connections: Not on file   No family history on file.  ROS: Currently denies lightheadedness, dizziness, Fever, chills, CP, SOB.   No personal history of DVT, PE, MI, or CVA. No loose teeth or dentures All other systems have been reviewed and were  otherwise currently negative with the exception of those mentioned in the HPI and as above.  BMI: Estimated body mass index is 19.37 kg/m as calculated from the following:   Height as of 03/14/22: '5\' 6"'$  (1.676 m).   Weight as of 03/14/22: 54.4 kg.  No results found for: "ALBUMIN" Diabetes: Patient does not have a diagnosis of diabetes.     Smoking Status:   reports that she has never smoked. She has never used smokeless tobacco.     Objective: Vitals: Ht: 5'6" Wt: 118 lbs Temp: 98.1 Pulse: 89 O2 95% on room air.   Physical Exam: General: Alert, NAD.   HEENT: EOMI, Good Neck Extension  Pulm: No increased work of breathing.  Clear B/L A/P w/o crackle or wheeze.  CV: RRR, No m/g/r appreciated  GI: soft, NT, ND Neuro: Neuro without gross focal deficit.  Sensation intact distally Skin: No lesions in the area of chief complaint MSK/Surgical Site: right shoulder pain with range of motion.  + Drop arm sign. Forward flexion/abduction approximately 0-170.  Internal rotation to L1.  External rotation to 10. Weak Rotator cuff strength.  NVI distally.  Imaging Review MRI shows 4 cm cuff tear with atrophy and rotator cuff arthropathy  Assessment: RIGHT SHOULDER ROTATOR CUFF ARTHROPATHY    Plan: Plan for Procedure(s): REVERSE SHOULDER ARTHROPLASTY  The patient history, physical exam, clinical judgement of the provider and imaging are consistent with end stage degenerative joint disease and reverse total joint arthroplasty is deemed medically necessary. The treatment options including medical management, injection therapy, and arthroplasty were discussed at length. The risks and benefits of Procedure(s): REVERSE SHOULDER ARTHROPLASTY were presented and reviewed.  The risks of nonoperative treatment, versus surgical intervention including but not limited to continued pain, aseptic loosening, stiffness, dislocation/subluxation, infection, bleeding, nerve injury, blood clots, cardiopulmonary  complications, morbidity, mortality, among others were discussed. The patient verbalizes understanding and wishes to proceed with the plan.   Dental prophylaxis discussed and recommended for 2 years postoperatively.  The patient does meet the criteria for TXA which will be used perioperatively.   The patient is planning to be discharged home care of family.   Ventura Bruns, PA-C 03/18/2022 1:53 PM

## 2022-03-26 ENCOUNTER — Encounter (HOSPITAL_COMMUNITY)
Admission: RE | Disposition: A | Discharge status: Home / Self Care | Payer: Self-pay | Source: Home / Self Care | Attending: Orthopedic Surgery

## 2022-03-26 ENCOUNTER — Ambulatory Visit (HOSPITAL_COMMUNITY): Payer: PPO | Admitting: Physician Assistant

## 2022-03-26 ENCOUNTER — Ambulatory Visit (HOSPITAL_COMMUNITY)
Admission: RE | Admit: 2022-03-26 | Discharge: 2022-03-26 | Disposition: A | Discharge status: Home / Self Care | Payer: PPO | Attending: Orthopedic Surgery | Admitting: Orthopedic Surgery

## 2022-03-26 ENCOUNTER — Ambulatory Visit (HOSPITAL_BASED_OUTPATIENT_CLINIC_OR_DEPARTMENT_OTHER): Payer: PPO | Admitting: Anesthesiology

## 2022-03-26 ENCOUNTER — Other Ambulatory Visit: Payer: Self-pay

## 2022-03-26 ENCOUNTER — Encounter (HOSPITAL_COMMUNITY): Payer: Self-pay | Admitting: Orthopedic Surgery

## 2022-03-26 ENCOUNTER — Ambulatory Visit (HOSPITAL_COMMUNITY): Payer: PPO

## 2022-03-26 DIAGNOSIS — M19011 Primary osteoarthritis, right shoulder: Secondary | ICD-10-CM | POA: Diagnosis present

## 2022-03-26 DIAGNOSIS — M3500 Sicca syndrome, unspecified: Secondary | ICD-10-CM | POA: Insufficient documentation

## 2022-03-26 DIAGNOSIS — J45909 Unspecified asthma, uncomplicated: Secondary | ICD-10-CM | POA: Insufficient documentation

## 2022-03-26 DIAGNOSIS — Z01818 Encounter for other preprocedural examination: Secondary | ICD-10-CM

## 2022-03-26 HISTORY — PX: REVERSE SHOULDER ARTHROPLASTY: SHX5054

## 2022-03-26 SURGERY — ARTHROPLASTY, SHOULDER, TOTAL, REVERSE
Anesthesia: General | Site: Shoulder | Laterality: Right

## 2022-03-26 MED ORDER — PHENYLEPHRINE HCL (PRESSORS) 10 MG/ML IV SOLN
INTRAVENOUS | Status: AC
  Filled 2022-03-26: qty 1

## 2022-03-26 MED ORDER — BUPIVACAINE HCL (PF) 0.5 % IJ SOLN
INTRAMUSCULAR | Status: DC | PRN
  Administered 2022-03-26: 10 mL via PERINEURAL

## 2022-03-26 MED ORDER — ORAL CARE MOUTH RINSE: 15.0000 mL | Freq: Once | OROMUCOSAL | Status: AC

## 2022-03-26 MED ORDER — ONDANSETRON HCL 4 MG/2ML IJ SOLN
INTRAMUSCULAR | Status: AC
  Filled 2022-03-26: qty 2

## 2022-03-26 MED ORDER — PHENYLEPHRINE 80 MCG/ML (10ML) SYRINGE FOR IV PUSH (FOR BLOOD PRESSURE SUPPORT)
PREFILLED_SYRINGE | INTRAVENOUS | Status: AC
  Filled 2022-03-26: qty 10

## 2022-03-26 MED ORDER — FENTANYL CITRATE PF 50 MCG/ML IJ SOSY: 25.0000 ug | PREFILLED_SYRINGE | INTRAMUSCULAR | Status: DC | PRN

## 2022-03-26 MED ORDER — PROPOFOL 10 MG/ML IV BOLUS
INTRAVENOUS | Status: DC | PRN
  Administered 2022-03-26: 140 mg via INTRAVENOUS

## 2022-03-26 MED ORDER — FENTANYL CITRATE PF 50 MCG/ML IJ SOSY
50.0000 ug | PREFILLED_SYRINGE | Freq: Once | INTRAMUSCULAR | Status: DC
  Filled 2022-03-26: qty 2

## 2022-03-26 MED ORDER — LIDOCAINE 2% (20 MG/ML) 5 ML SYRINGE
INTRAMUSCULAR | Status: DC | PRN
  Administered 2022-03-26: 60 mg via INTRAVENOUS

## 2022-03-26 MED ORDER — BUPIVACAINE LIPOSOME 1.3 % IJ SUSP
INTRAMUSCULAR | Status: DC | PRN
  Administered 2022-03-26: 10 mL via PERINEURAL

## 2022-03-26 MED ORDER — TRANEXAMIC ACID-NACL 1000-0.7 MG/100ML-% IV SOLN
1000.0000 mg | INTRAVENOUS | Status: AC
  Administered 2022-03-26: 1000 mg via INTRAVENOUS
  Filled 2022-03-26: qty 100

## 2022-03-26 MED ORDER — DEXAMETHASONE SODIUM PHOSPHATE 10 MG/ML IJ SOLN
INTRAMUSCULAR | Status: DC | PRN
  Administered 2022-03-26: 8 mg via INTRAVENOUS

## 2022-03-26 MED ORDER — SUGAMMADEX SODIUM 200 MG/2ML IV SOLN
INTRAVENOUS | Status: DC | PRN
  Administered 2022-03-26: 150 mg via INTRAVENOUS

## 2022-03-26 MED ORDER — CEFAZOLIN SODIUM-DEXTROSE 2-4 GM/100ML-% IV SOLN
2.0000 g | INTRAVENOUS | Status: AC
  Administered 2022-03-26: 2 g via INTRAVENOUS
  Filled 2022-03-26: qty 100

## 2022-03-26 MED ORDER — FENTANYL CITRATE (PF) 100 MCG/2ML IJ SOLN
INTRAMUSCULAR | Status: AC
  Filled 2022-03-26: qty 2

## 2022-03-26 MED ORDER — CHLORHEXIDINE GLUCONATE 0.12 % MT SOLN
15.0000 mL | Freq: Once | OROMUCOSAL | Status: AC
  Administered 2022-03-26: 15 mL via OROMUCOSAL

## 2022-03-26 MED ORDER — ONDANSETRON HCL 4 MG/2ML IJ SOLN: 4.0000 mg | Freq: Once | INTRAMUSCULAR | Status: DC | PRN

## 2022-03-26 MED ORDER — TRAMADOL HCL 50 MG PO TABS: 50.0000 mg | ORAL_TABLET | Freq: Four times a day (QID) | ORAL | 0 refills | Status: AC | PRN

## 2022-03-26 MED ORDER — LACTATED RINGERS IV BOLUS
500.0000 mL | Freq: Once | INTRAVENOUS | Status: AC
  Administered 2022-03-26: 500 mL via INTRAVENOUS

## 2022-03-26 MED ORDER — POVIDONE-IODINE 10 % EX SWAB
2.0000 | Freq: Once | CUTANEOUS | Status: AC
  Administered 2022-03-26: 2 via TOPICAL

## 2022-03-26 MED ORDER — EPHEDRINE 5 MG/ML INJ
INTRAVENOUS | Status: AC
  Filled 2022-03-26: qty 5

## 2022-03-26 MED ORDER — LACTATED RINGERS IV SOLN: INTRAVENOUS | Status: DC

## 2022-03-26 MED ORDER — CELECOXIB 200 MG PO CAPS
ORAL_CAPSULE | ORAL | Status: AC
  Filled 2022-03-26: qty 1

## 2022-03-26 MED ORDER — ACETAMINOPHEN 500 MG PO TABS
ORAL_TABLET | ORAL | Status: AC
  Filled 2022-03-26: qty 2

## 2022-03-26 MED ORDER — LACTATED RINGERS IV BOLUS
250.0000 mL | Freq: Once | INTRAVENOUS | Status: AC
  Administered 2022-03-26: 250 mL via INTRAVENOUS

## 2022-03-26 MED ORDER — ONDANSETRON HCL 4 MG PO TABS: 4.0000 mg | ORAL_TABLET | Freq: Three times a day (TID) | ORAL | 0 refills | Status: DC | PRN

## 2022-03-26 MED ORDER — 0.9 % SODIUM CHLORIDE (POUR BTL) OPTIME
TOPICAL | Status: DC | PRN
  Administered 2022-03-26: 1000 mL

## 2022-03-26 MED ORDER — PHENYLEPHRINE 80 MCG/ML (10ML) SYRINGE FOR IV PUSH (FOR BLOOD PRESSURE SUPPORT)
PREFILLED_SYRINGE | INTRAVENOUS | Status: DC | PRN
  Administered 2022-03-26: 80 ug via INTRAVENOUS
  Administered 2022-03-26: 240 ug via INTRAVENOUS
  Administered 2022-03-26: 80 ug via INTRAVENOUS

## 2022-03-26 MED ORDER — ACETAMINOPHEN 500 MG PO TABS
1000.0000 mg | ORAL_TABLET | Freq: Once | ORAL | Status: AC
  Administered 2022-03-26: 1000 mg via ORAL
  Filled 2022-03-26: qty 2

## 2022-03-26 MED ORDER — PHENYLEPHRINE HCL-NACL 20-0.9 MG/250ML-% IV SOLN
INTRAVENOUS | Status: DC | PRN
  Administered 2022-03-26: 40 ug/min via INTRAVENOUS

## 2022-03-26 MED ORDER — STERILE WATER FOR IRRIGATION IR SOLN
Status: DC | PRN
  Administered 2022-03-26: 2000 mL

## 2022-03-26 MED ORDER — PROPOFOL 10 MG/ML IV BOLUS
INTRAVENOUS | Status: AC
  Filled 2022-03-26: qty 20

## 2022-03-26 MED ORDER — ROCURONIUM BROMIDE 10 MG/ML (PF) SYRINGE
PREFILLED_SYRINGE | INTRAVENOUS | Status: AC
  Filled 2022-03-26: qty 10

## 2022-03-26 MED ORDER — ROCURONIUM BROMIDE 10 MG/ML (PF) SYRINGE
PREFILLED_SYRINGE | INTRAVENOUS | Status: DC | PRN
  Administered 2022-03-26: 50 mg via INTRAVENOUS
  Administered 2022-03-26: 10 mg via INTRAVENOUS

## 2022-03-26 MED ORDER — ONDANSETRON HCL 4 MG/2ML IJ SOLN
INTRAMUSCULAR | Status: DC | PRN
  Administered 2022-03-26: 4 mg via INTRAVENOUS

## 2022-03-26 MED ORDER — ACETAMINOPHEN 325 MG PO TABS: 650.0000 mg | ORAL_TABLET | Freq: Four times a day (QID) | ORAL | 1 refills | Status: DC | PRN

## 2022-03-26 MED ORDER — LIDOCAINE HCL (PF) 2 % IJ SOLN
INTRAMUSCULAR | Status: AC
  Filled 2022-03-26: qty 5

## 2022-03-26 MED ORDER — DEXAMETHASONE SODIUM PHOSPHATE 10 MG/ML IJ SOLN
INTRAMUSCULAR | Status: AC
  Filled 2022-03-26: qty 1

## 2022-03-26 MED ORDER — MIDAZOLAM HCL 2 MG/2ML IJ SOLN
1.0000 mg | Freq: Once | INTRAMUSCULAR | Status: DC
  Filled 2022-03-26: qty 2

## 2022-03-26 MED ORDER — CEFAZOLIN SODIUM-DEXTROSE 2-4 GM/100ML-% IV SOLN
INTRAVENOUS | Status: AC
  Filled 2022-03-26: qty 100

## 2022-03-26 SURGICAL SUPPLY — 60 items
BAG COUNTER SPONGE SURGICOUNT (BAG) IMPLANT
BAG ZIPLOCK 12X15 (MISCELLANEOUS) ×1 IMPLANT
BASEPLATE GLENOSPHERE 25 (Plate) IMPLANT
BEARING HUMERAL SHLDER 36M STD (Shoulder) IMPLANT
BIT DRILL QUICK REL 1/8 2PK SL (DRILL) IMPLANT
BIT DRILL TWIST 2.7 (BIT) IMPLANT
BLADE SAW SGTL 73X25 THK (BLADE) ×1 IMPLANT
BOOTIES KNEE HIGH SLOAN (MISCELLANEOUS) ×1 IMPLANT
CLSR STERI-STRIP ANTIMIC 1/2X4 (GAUZE/BANDAGES/DRESSINGS) ×1 IMPLANT
COVER BACK TABLE 60X90IN (DRAPES) ×1 IMPLANT
COVER SURGICAL LIGHT HANDLE (MISCELLANEOUS) ×1 IMPLANT
DRAPE ORTHO SPLIT 77X108 STRL (DRAPES) ×2
DRAPE SHEET LG 3/4 BI-LAMINATE (DRAPES) ×2 IMPLANT
DRAPE SURG 17X11 SM STRL (DRAPES) ×1 IMPLANT
DRAPE SURG ORHT 6 SPLT 77X108 (DRAPES) ×2 IMPLANT
DRAPE U-SHAPE 47X51 STRL (DRAPES) ×1 IMPLANT
DRILL QUICK RELEASE 1/8 INCH (DRILL) ×1
DRSG AQUACEL AG ADV 3.5X 6 (GAUZE/BANDAGES/DRESSINGS) IMPLANT
DRSG MEPILEX POST OP 4X8 (GAUZE/BANDAGES/DRESSINGS) ×1 IMPLANT
DURAPREP 26ML APPLICATOR (WOUND CARE) ×2 IMPLANT
ELECT REM PT RETURN 15FT ADLT (MISCELLANEOUS) ×1 IMPLANT
FACESHIELD WRAPAROUND (MASK) ×2 IMPLANT
FACESHIELD WRAPAROUND OR TEAM (MASK) IMPLANT
GLENOID SPHERE STD STRL 36MM (Orthopedic Implant) IMPLANT
GLOVE BIO SURGEON STRL SZ 6.5 (GLOVE) ×1 IMPLANT
GLOVE BIOGEL PI IND STRL 7.0 (GLOVE) ×1 IMPLANT
GLOVE BIOGEL PI IND STRL 8 (GLOVE) ×1 IMPLANT
GLOVE ORTHO TXT STRL SZ7.5 (GLOVE) ×1 IMPLANT
GOWN STRL SURGICAL XL XLNG (GOWN DISPOSABLE) ×2 IMPLANT
HOOD PEEL AWAY FLYTE STAYCOOL (MISCELLANEOUS) ×2 IMPLANT
KIT BASIN OR (CUSTOM PROCEDURE TRAY) ×1 IMPLANT
KIT TURNOVER KIT A (KITS) IMPLANT
PACK SHOULDER (CUSTOM PROCEDURE TRAY) ×1 IMPLANT
PAD ORTHO SHOULDER 7X19 LRG (SOFTGOODS) IMPLANT
PIN THREADED REVERSE (PIN) IMPLANT
PROTECTOR NERVE ULNAR (MISCELLANEOUS) ×1 IMPLANT
RESTRAINT HEAD UNIVERSAL NS (MISCELLANEOUS) ×1 IMPLANT
SCREW BONE CORT 6.5X35MM (Screw) IMPLANT
SCREW BONE LOCKING 4.75X30X3.5 (Screw) IMPLANT
SHOULDER HUMERAL BEAR 36M STD (Shoulder) ×1 IMPLANT
SLING ARM FOAM STRAP LRG (SOFTGOODS) IMPLANT
SLING ARM IMMOBILIZER LRG (SOFTGOODS) ×1 IMPLANT
SMARTMIX MINI TOWER (MISCELLANEOUS)
SPIKE FLUID TRANSFER (MISCELLANEOUS) IMPLANT
SPONGE T-LAP 4X18 ~~LOC~~+RFID (SPONGE) IMPLANT
STEM HUMERAL STRL 11MMX55MM (Stem) IMPLANT
SUCTION FRAZIER HANDLE 12FR (TUBING) ×1
SUCTION TUBE FRAZIER 12FR DISP (TUBING) ×1 IMPLANT
SUPPORT WRAP ARM LG (MISCELLANEOUS) ×1 IMPLANT
SUT MAXBRAID #2 CVD NDL (SUTURE) IMPLANT
SUT MAXBRAID #5 CCS-NDL 2PK (SUTURE) IMPLANT
SUT MNCRL AB 4-0 PS2 18 (SUTURE) IMPLANT
SUT VIC AB 1 CT1 36 (SUTURE) ×1 IMPLANT
SUT VIC AB 2-0 CT1 27 (SUTURE) ×1
SUT VIC AB 2-0 CT1 TAPERPNT 27 (SUTURE) ×1 IMPLANT
SUT VIC AB 3-0 SH 8-18 (SUTURE) ×1 IMPLANT
TOWEL OR 17X26 10 PK STRL BLUE (TOWEL DISPOSABLE) ×1 IMPLANT
TOWEL OR NON WOVEN STRL DISP B (DISPOSABLE) ×1 IMPLANT
TOWER SMARTMIX MINI (MISCELLANEOUS) IMPLANT
TRAY HUM REV SHOULDER STD +6 (Shoulder) IMPLANT

## 2022-03-26 NOTE — Discharge Instructions (Signed)
Diet: As you were doing prior to hospitalization   Shower/Dressing:  May shower but keep the wounds dry, use an occlusive plastic wrap, NO SOAKING IN TUB.  If the bandage gets wet, change with a clean dry gauze. Otherwise keep dressing in place until your follow-up visit at our office. There are sticky tapes (steri-strips) on your wounds and all the stitches are absorbable.  Leave the steri-strips in place when changing your dressings, they will peel off with time, usually 2-3 weeks.  Activity:  Increase activity slowly as tolerated, but follow the weight bearing instructions below.  The rules on driving is that you can not be taking narcotics while you drive, and you must feel in control of the vehicle.    Weight Bearing:  no weight bearing with right arm  To prevent constipation: you may use a stool softener such as -  Colace (over the counter) 100 mg by mouth twice a day  Drink plenty of fluids (prune juice may be helpful) and high fiber foods Miralax (over the counter) for constipation as needed.    Itching:  If you experience itching with your medications, try taking only a single pain pill, or even half a pain pill at a time.  You may take up to 10 pain pills per day, and you can also use benadryl over the counter for itching or also to help with sleep.   Precautions:  If you experience chest pain or shortness of breath - call 911 immediately for transfer to the hospital emergency department!!  If you develop a fever greater that 101 F, purulent drainage from wound, increased redness or drainage from wound, or calf pain -- Call the office at [phone]                                                Follow- Up Appointment:  Please call for an appointment to be seen in 2 weeks [place] - [phone]

## 2022-03-26 NOTE — Progress Notes (Signed)
Per dr. Gifford Shave no sedation for nerve block

## 2022-03-26 NOTE — Anesthesia Procedure Notes (Signed)
Procedure Name: Intubation Date/Time: 03/26/2022 10:54 AM  Performed by: Jenne Campus, CRNAPre-anesthesia Checklist: Patient identified, Emergency Drugs available, Suction available and Patient being monitored Patient Re-evaluated:Patient Re-evaluated prior to induction Oxygen Delivery Method: Circle System Utilized Preoxygenation: Pre-oxygenation with 100% oxygen Induction Type: IV induction Ventilation: Mask ventilation without difficulty Laryngoscope Size: Miller and 3 Grade View: Grade I Tube type: Oral Tube size: 7.0 mm Number of attempts: 1 Airway Equipment and Method: Stylet and Oral airway Placement Confirmation: ETT inserted through vocal cords under direct vision, positive ETCO2 and breath sounds checked- equal and bilateral Secured at: 23 cm Tube secured with: Tape Dental Injury: Teeth and Oropharynx as per pre-operative assessment

## 2022-03-26 NOTE — Transfer of Care (Signed)
Immediate Anesthesia Transfer of Care Note  Patient: Tammy Sutton  Procedure(s) Performed: REVERSE SHOULDER ARTHROPLASTY (Right: Shoulder)  Patient Location: PACU  Anesthesia Type:General  Level of Consciousness: oriented, drowsy and patient cooperative  Airway & Oxygen Therapy: Patient Spontanous Breathing and Patient connected to face mask oxygen  Post-op Assessment: Report given to RN and Post -op Vital signs reviewed and stable  Post vital signs: Reviewed  Last Vitals:  Vitals Value Taken Time  BP 160/86 03/26/22 1321  Temp 37.4 C 03/26/22 1321  Pulse 88 03/26/22 1322  Resp 23 03/26/22 1322  SpO2 99 % 03/26/22 1322  Vitals shown include unvalidated device data.  Last Pain:  Vitals:   03/26/22 1321  TempSrc:   PainSc: Asleep         Complications: No notable events documented.

## 2022-03-26 NOTE — Anesthesia Preprocedure Evaluation (Addendum)
Anesthesia Evaluation  Patient identified by MRN, date of birth, ID band Patient awake    Reviewed: Allergy & Precautions, NPO status , Patient's Chart, lab work & pertinent test results  History of Anesthesia Complications (+) PONV and history of anesthetic complications  Airway Mallampati: II  TM Distance: >3 FB Neck ROM: Full    Dental  (+) Teeth Intact, Dental Advisory Given   Pulmonary asthma ,    Pulmonary exam normal breath sounds clear to auscultation       Cardiovascular negative cardio ROS Normal cardiovascular exam Rhythm:Regular Rate:Normal     Neuro/Psych negative neurological ROS  negative psych ROS   GI/Hepatic negative GI ROS, Neg liver ROS,   Endo/Other  negative endocrine ROS  Renal/GU negative Renal ROS     Musculoskeletal  (+) Arthritis  (DJD RIGHT SHOULDER), Sjogren's syndrome    Abdominal   Peds  Hematology  (+) Blood dyscrasia, anemia ,   Anesthesia Other Findings Day of surgery medications reviewed with the patient.  Reproductive/Obstetrics                             Anesthesia Physical Anesthesia Plan  ASA: 2  Anesthesia Plan: General   Post-op Pain Management: Regional block* and Tylenol PO (pre-op)*   Induction: Intravenous  PONV Risk Score and Plan: 4 or greater and Dexamethasone, Ondansetron and Treatment may vary due to age or medical condition  Airway Management Planned: Oral ETT  Additional Equipment:   Intra-op Plan:   Post-operative Plan: Extubation in OR  Informed Consent: I have reviewed the patients History and Physical, chart, labs and discussed the procedure including the risks, benefits and alternatives for the proposed anesthesia with the patient or authorized representative who has indicated his/her understanding and acceptance.     Dental advisory given  Plan Discussed with: CRNA  Anesthesia Plan Comments:          Anesthesia Quick Evaluation

## 2022-03-26 NOTE — Interval H&P Note (Signed)
History and Physical Interval Note:  03/26/2022 10:07 AM  Tammy Sutton  has presented today for surgery, with the diagnosis of DJD RIGHT SHOULDER.  The various methods of treatment have been discussed with the patient and family. After consideration of risks, benefits and other options for treatment, the patient has consented to  Procedure(s): REVERSE SHOULDER ARTHROPLASTY (Right) as a surgical intervention.  The patient's history has been reviewed, patient examined, no change in status, stable for surgery.  I have reviewed the patient's chart and labs.  Questions were answered to the patient's satisfaction.     Johnny Bridge

## 2022-03-26 NOTE — Evaluation (Signed)
Occupational Therapy Evaluation Patient Details Name: Tammy Sutton MRN: [id number] DOB: 12-10-1945 Today's Date: 03/26/2022   History of Present Illness Tammy Sutton is a 76 yr old female who is s/p a R reverse total shoulder arthroplasty on 03-26-2022, due to R shoulder rotator cuff arthropathy.   Clinical Impression   Pt is s/p shoulder arthroplasty without functional use of right dominant upper extremity. Therapist provided education and instruction to patient and her friend with regards to exercises, general precautions, UE positioning, how to correctly donn upper extremity clothing and recommendations for bathing while maintaining shoulder precautions, use of ice for pain and edema management, and correctly donning/doffing sling. Patient and friend verbalized understanding and demonstrated understanding as needed. Patient needed min assistance to donn shirt, underwear, and pants, then SBA to donn shoes. Instruction was provided on compensatory strategies to perform ADLs. Patient to follow up with MD for further therapy needs.        Recommendations for follow up therapy are one component of a multi-disciplinary discharge planning process, led by the attending physician.  Recommendations may be updated based on patient status, additional functional criteria and insurance authorization.   Follow Up Recommendations  Follow physician's recommendations for discharge plan and follow up therapies    Assistance Recommended at Discharge Set up Supervision/Assistance  Patient can return home with the following Assist for transportation;Direct supervision/assist for financial management;Assistance with cooking/housework    Functional Status Assessment  Patient has had a recent decline in their functional status and demonstrates the ability to make significant improvements in function in a reasonable and predictable amount of time.  Equipment Recommendations  None recommended by OT        Precautions / Restrictions Precautions Precautions: Shoulder Shoulder Interventions: Shoulder sling/immobilizer Precaution Booklet Issued: Yes (comment) Required Braces or Orthoses: Sling Restrictions Weight Bearing Restrictions: Yes RUE Weight Bearing: Non weight bearing Other Position/Activity Restrictions: Sling at all times except ADL/exercise Yes,  Non weight bearing Yes,  AROM elbow, wrist and hand to tolerance Yes,  PROM of shoulder No,  AROM of shoulder No      Mobility B    Transfers Overall transfer level: Needs assistance   Transfers: Sit to/from Stand Sit to Stand: Supervision          Balance     Sitting balance-Leahy Scale: Good       Standing balance-Leahy Scale: Good             ADL either performed or assessed with clinical judgement     Vision Patient Visual Report: No change from baseline              Pertinent Vitals/Pain Pain Assessment Pain Assessment: No/denies pain     Hand Dominance Right      Communication Communication Communication: No difficulties   Cognition Arousal/Alertness: Awake/alert Behavior During Therapy: WFL for tasks assessed/performed Overall Cognitive Status: Within Functional Limits for tasks assessed          General Comments: Oriented x4, able to follow commands           Shoulder Instructions Shoulder Instructions Donning/doffing shirt without moving shoulder: Minimal assistance Donning/doffing sling/immobilizer: Patient able to independently direct caregiver Correct positioning of sling/immobilizer: Patient able to independently direct caregiver ROM for elbow, wrist and digits of operated UE: Patient able to independently direct caregiver Sling wearing schedule (on at all times/off for ADL's): Patient able to independently direct caregiver Proper positioning of operated UE when showering: Patient able to independently direct caregiver Positioning  of UE while sleeping: Patient able to  independently direct caregiver    Home Living Family/patient expects to be discharged to:: Private residence Living Arrangements: Alone Available Help at Discharge: Friend(s) Type of Home: House Home Access: Stairs to enter     Home Layout: Multi-level;1/2 bath on main level           Prior Functioning/Environment Prior Level of Function : Independent/Modified Independent;Driving             Mobility Comments: Independent with ambulation ADLs Comments: Independent with ADLs, driving, cooking, and cleaning.        OT Problem List: Impaired UE functional use             AM-PAC OT "6 Clicks" Daily Activity     Outcome Measure Help from another person eating meals?: None Help from another person taking care of personal grooming?: None Help from another person toileting, which includes using toliet, bedpan, or urinal?: A Little Help from another person bathing (including washing, rinsing, drying)?: A Little Help from another person to put on and taking off regular upper body clothing?: A Little Help from another person to put on and taking off regular lower body clothing?: A Little 6 Click Score: 20   End of Session Nurse Communication: Other (comment) (Patient cleared from therapy)  Activity Tolerance: Patient tolerated treatment well Patient left: in chair;with call bell/phone within reach;with family/visitor present  OT Visit Diagnosis: Muscle weakness (generalized) (M62.81)                Time: 1610-1640 OT Time Calculation (min): 30 min Charges:  OT General Charges $OT Visit: 1 Visit OT Evaluation $OT Eval Low Complexity: 1 Low OT Treatments $Self Care/Home Management : 8-22 mins    Tammy Sutton, OTR/L 03/26/2022, 5:30 PM

## 2022-03-26 NOTE — Anesthesia Procedure Notes (Signed)
Anesthesia Regional Block: Interscalene brachial plexus block   Pre-Anesthetic Checklist: , timeout performed,  Correct Patient, Correct Site, Correct Laterality,  Correct Procedure, Correct Position, site marked,  Risks and benefits discussed,  Surgical consent,  Pre-op evaluation,  At surgeon's request and post-op pain management  Laterality: Right  Prep: chloraprep       Needles:  Injection technique: Single-shot  Needle Type: Echogenic Stimulator Needle     Needle Length: 9cm  Needle Gauge: 22     Additional Needles:   Procedures:,,,, ultrasound used (permanent image in chart),,    Narrative:  Start time: 03/26/2022 9:10 AM End time: 03/26/2022 9:16 AM Injection made incrementally with aspirations every 5 mL.  Performed by: Personally  Anesthesiologist: Santa Lighter, MD  Additional Notes: Functioning IV was confirmed and monitors were applied.  A 44m 22ga Arrow echogenic stimulator needle was used. Sterile prep and drape, hand hygiene, and sterile gloves were used.  Negative aspiration and negative test dose prior to incremental administration of local anesthetic. The patient tolerated the procedure well.  Ultrasound guidance: relevent anatomy identified, needle position confirmed, local anesthetic spread visualized around nerve(s), vascular puncture avoided.  Image printed for medical record.

## 2022-03-26 NOTE — Op Note (Signed)
03/26/2022  1:09 PM  PATIENT:  Tammy Sutton    PRE-OPERATIVE DIAGNOSIS: Right shoulder rotator cuff arthropathy  POST-OPERATIVE DIAGNOSIS:  Same  PROCEDURE: RIGHT reverse Total Shoulder Arthroplasty  SURGEON:  Johnny Bridge, MD  PHYSICIAN ASSISTANT: Merlene Pulling, PA-C, present and scrubbed throughout the case, critical for completion in a timely fashion, and for retraction, instrumentation, and closure.  ANESTHESIA:   General with interscalene block using Exparel  ESTIMATED BLOOD LOSS: 150 mL  UNIQUE ASPECTS OF THE CASE: I was able to find the biceps and perform a tenodesis.  She had a fairly high riding humerus, and it was very difficult to bring it down.  I ended up having to cut the humerus 3 times, and in fact remove the real prosthesis and replace it after lowering the cut because the shoulder was too tight and would not reduce.  Ultimately I was fairly low on the neck, as low as I comfortably could go, and even then it was still difficult to reduce.  The soft tissues however did repair nicely, and I had a nice subscapularis repair down to bone.  I do not really have a good explanation why it was so difficult to get reduced, and she had excellent preoperative motion so hopefully we did not over tighten her construct.  The conjoined tendon did not seem to have undue tension.  PREOPERATIVE INDICATIONS:  Tammy Sutton is a  76 y.o. female with a diagnosis of rotator cuff arthropathy RIGHT SHOULDER who failed conservative measures and elected for surgical management.    The risks benefits and alternatives were discussed with the patient preoperatively including but not limited to the risks of infection, bleeding, nerve injury, cardiopulmonary complications, the need for revision surgery, dislocation, brachial plexus palsy, incomplete relief of pain, among others, and the patient was willing to proceed.  OPERATIVE IMPLANTS: Biomet size 11 micro humeral stem press-fit with a 40 + 6  mm offset reverse shoulder arthroplasty tray with a 36 mm Vivacit-E standardy polyethylene liner and a 36 mm glenosphere set on B placed with inferior offset, with a mini baseplate and 2 locking screws and one central nonlocking screw.  OPERATIVE FINDINGS: Advanced rotator cuff arthropathy with eburnation of the undersurface of the acromion.  OPERATIVE PROCEDURE: The patient was brought to the operating room and placed in the supine position. General anesthesia was administered. IV antibiotics were given.  Time out was performed. The upper extremity was prepped and draped in usual sterile fashion. The patient was in a beachchair position. Deltopectoral approach was carried out. The biceps was tenodesed to the pectoralis tendon with #2 max braid. The subscapularis was released off of the bone.   I then performed circumferential releases of the humerus, and then dislocated the head, and then reamed with the reamer to the above named size.  I then applied the jig, and cut the humeral head in 30 of retroversion, and then turned my attention to the glenoid.  Deep retractors were placed, and I resected the labrum, and then placed a guidepin into the center position on the glenoid, with slight inferior inclination. I then reamed over the guidepin, and this created a small metaphyseal cancellus blush inferiorly, removing just the cartilage to the subchondral bone superiorly. The base plate was selected and impacted place, and then I secured it centrally with a nonlocking screw, and I had excellent purchase both inferiorly and superiorly. I had fairly stout fixation both centrally, superiorly, and inferiorly, with excellent seating anteriorly and  posteriorly so I did not feel that I needed anterior and posterior screws.  I then turned my attention to the glenosphere, and impacted this into place, placing slight inferior offset (set on B).   The glenosphere was completely seated, and had engagement of the [facility]  taper. I then turned my attention back to the humerus.  I sequentially broached, and then trialed, and initially was very difficult to reduce.  I removed the broach, we cut the humerus, and then trialed again.  I was still unable to reduce it.  I felt however it was pretty close, and after a third head cut I placed the real prosthesis.  It was still just a little bit proud, and it was too tight to reduce.  When I removed the prosthesis, removed the sutures for the subscapularis repair, and then made a fourth head cut, I was fairly low at this stage, and then I rebroached in order to make sure that I got down low enough, and also we reamed the canal.  I then replaced the prosthesis, I was still seating just a little bit proud but definitely lower than I was before.  I was able to get this reduced with some difficulty.  It was found to restore soft tissue tension, and it had 3 finger tightness. The shoulder felt stable throughout functional motion.  Before I placed the real prosthesis I had also placed a total of 1 #2 and 2 #5 max braid through drill holes in the humerus for later subscapularis repair.   I then used these to repair the subscapularis. This came down to bone.  I then irrigated the shoulder copiously once more, repaired the deltopectoral interval with Vicryl followed by subcutaneous Vicryl with Steri-Strips and sterile gauze for the skin. The patient was awakened and returned back in stable and satisfactory condition. There were no complications and She tolerated the procedure well.

## 2022-03-27 ENCOUNTER — Encounter (HOSPITAL_COMMUNITY): Payer: Self-pay | Admitting: Orthopedic Surgery

## 2022-03-27 NOTE — Anesthesia Postprocedure Evaluation (Signed)
Anesthesia Post Note  Patient: Tammy Sutton  Procedure(s) Performed: REVERSE SHOULDER ARTHROPLASTY (Right: Shoulder)     Patient location during evaluation: PACU Anesthesia Type: General Level of consciousness: awake and alert Pain management: pain level controlled Vital Signs Assessment: post-procedure vital signs reviewed and stable Respiratory status: spontaneous breathing, nonlabored ventilation, respiratory function stable and patient connected to nasal cannula oxygen Cardiovascular status: blood pressure returned to baseline and stable Postop Assessment: no apparent nausea or vomiting Anesthetic complications: no   No notable events documented.  Last Vitals:  Vitals:   03/26/22 1500 03/26/22 1606  BP: (!) 159/82 (!) 156/84  Pulse: 72 74  Resp: 20 18  Temp: 37 C 37 C  SpO2: 97% 97%    Last Pain:  Vitals:   03/26/22 1606  TempSrc:   PainSc: 0-No pain                 Santa Lighter

## 2022-05-28 DIAGNOSIS — Z471 Aftercare following joint replacement surgery: Secondary | ICD-10-CM | POA: Diagnosis not present

## 2022-05-28 DIAGNOSIS — M25511 Pain in right shoulder: Secondary | ICD-10-CM | POA: Diagnosis not present

## 2022-05-28 DIAGNOSIS — M6281 Muscle weakness (generalized): Secondary | ICD-10-CM | POA: Diagnosis not present

## 2022-05-31 DIAGNOSIS — M6281 Muscle weakness (generalized): Secondary | ICD-10-CM | POA: Diagnosis not present

## 2022-05-31 DIAGNOSIS — M25511 Pain in right shoulder: Secondary | ICD-10-CM | POA: Diagnosis not present

## 2022-05-31 DIAGNOSIS — Z471 Aftercare following joint replacement surgery: Secondary | ICD-10-CM | POA: Diagnosis not present

## 2022-06-03 DIAGNOSIS — M19011 Primary osteoarthritis, right shoulder: Secondary | ICD-10-CM | POA: Diagnosis not present

## 2022-06-04 DIAGNOSIS — M6281 Muscle weakness (generalized): Secondary | ICD-10-CM | POA: Diagnosis not present

## 2022-06-04 DIAGNOSIS — Z471 Aftercare following joint replacement surgery: Secondary | ICD-10-CM | POA: Diagnosis not present

## 2022-06-04 DIAGNOSIS — M25511 Pain in right shoulder: Secondary | ICD-10-CM | POA: Diagnosis not present

## 2022-06-07 DIAGNOSIS — M25511 Pain in right shoulder: Secondary | ICD-10-CM | POA: Diagnosis not present

## 2022-06-07 DIAGNOSIS — M6281 Muscle weakness (generalized): Secondary | ICD-10-CM | POA: Diagnosis not present

## 2022-06-07 DIAGNOSIS — Z471 Aftercare following joint replacement surgery: Secondary | ICD-10-CM | POA: Diagnosis not present

## 2022-06-10 DIAGNOSIS — M9901 Segmental and somatic dysfunction of cervical region: Secondary | ICD-10-CM | POA: Diagnosis not present

## 2022-06-10 DIAGNOSIS — S335XXA Sprain of ligaments of lumbar spine, initial encounter: Secondary | ICD-10-CM | POA: Diagnosis not present

## 2022-06-10 DIAGNOSIS — M9903 Segmental and somatic dysfunction of lumbar region: Secondary | ICD-10-CM | POA: Diagnosis not present

## 2022-06-11 DIAGNOSIS — M25511 Pain in right shoulder: Secondary | ICD-10-CM | POA: Diagnosis not present

## 2022-06-11 DIAGNOSIS — Z471 Aftercare following joint replacement surgery: Secondary | ICD-10-CM | POA: Diagnosis not present

## 2022-06-11 DIAGNOSIS — M6281 Muscle weakness (generalized): Secondary | ICD-10-CM | POA: Diagnosis not present

## 2022-06-14 DIAGNOSIS — N183 Chronic kidney disease, stage 3 unspecified: Secondary | ICD-10-CM | POA: Diagnosis not present

## 2022-06-14 DIAGNOSIS — I10 Essential (primary) hypertension: Secondary | ICD-10-CM | POA: Diagnosis not present

## 2022-06-14 DIAGNOSIS — Z471 Aftercare following joint replacement surgery: Secondary | ICD-10-CM | POA: Diagnosis not present

## 2022-06-14 DIAGNOSIS — Z1329 Encounter for screening for other suspected endocrine disorder: Secondary | ICD-10-CM | POA: Diagnosis not present

## 2022-06-14 DIAGNOSIS — R7303 Prediabetes: Secondary | ICD-10-CM | POA: Diagnosis not present

## 2022-06-14 DIAGNOSIS — D72819 Decreased white blood cell count, unspecified: Secondary | ICD-10-CM | POA: Diagnosis not present

## 2022-06-14 DIAGNOSIS — M25511 Pain in right shoulder: Secondary | ICD-10-CM | POA: Diagnosis not present

## 2022-06-14 DIAGNOSIS — E7801 Familial hypercholesterolemia: Secondary | ICD-10-CM | POA: Diagnosis not present

## 2022-06-14 DIAGNOSIS — M6281 Muscle weakness (generalized): Secondary | ICD-10-CM | POA: Diagnosis not present

## 2022-06-17 DIAGNOSIS — M25511 Pain in right shoulder: Secondary | ICD-10-CM | POA: Diagnosis not present

## 2022-06-17 DIAGNOSIS — Z471 Aftercare following joint replacement surgery: Secondary | ICD-10-CM | POA: Diagnosis not present

## 2022-06-17 DIAGNOSIS — M6281 Muscle weakness (generalized): Secondary | ICD-10-CM | POA: Diagnosis not present

## 2022-06-19 DIAGNOSIS — S46001A Unspecified injury of muscle(s) and tendon(s) of the rotator cuff of right shoulder, initial encounter: Secondary | ICD-10-CM | POA: Diagnosis not present

## 2022-06-19 DIAGNOSIS — D649 Anemia, unspecified: Secondary | ICD-10-CM | POA: Diagnosis not present

## 2022-06-19 DIAGNOSIS — M3505 Sjogren syndrome with inflammatory arthritis: Secondary | ICD-10-CM | POA: Diagnosis not present

## 2022-06-19 DIAGNOSIS — M159 Polyosteoarthritis, unspecified: Secondary | ICD-10-CM | POA: Diagnosis not present

## 2022-06-19 DIAGNOSIS — G63 Polyneuropathy in diseases classified elsewhere: Secondary | ICD-10-CM | POA: Diagnosis not present

## 2022-06-19 DIAGNOSIS — Z6820 Body mass index (BMI) 20.0-20.9, adult: Secondary | ICD-10-CM | POA: Diagnosis not present

## 2022-06-19 DIAGNOSIS — R030 Elevated blood-pressure reading, without diagnosis of hypertension: Secondary | ICD-10-CM | POA: Diagnosis not present

## 2022-06-19 DIAGNOSIS — R7303 Prediabetes: Secondary | ICD-10-CM | POA: Diagnosis not present

## 2022-06-19 DIAGNOSIS — G5681 Other specified mononeuropathies of right upper limb: Secondary | ICD-10-CM | POA: Diagnosis not present

## 2022-06-19 DIAGNOSIS — Z96611 Presence of right artificial shoulder joint: Secondary | ICD-10-CM | POA: Diagnosis not present

## 2022-06-19 DIAGNOSIS — N1831 Chronic kidney disease, stage 3a: Secondary | ICD-10-CM | POA: Diagnosis not present

## 2022-06-21 DIAGNOSIS — M6281 Muscle weakness (generalized): Secondary | ICD-10-CM | POA: Diagnosis not present

## 2022-06-21 DIAGNOSIS — M25511 Pain in right shoulder: Secondary | ICD-10-CM | POA: Diagnosis not present

## 2022-06-21 DIAGNOSIS — Z471 Aftercare following joint replacement surgery: Secondary | ICD-10-CM | POA: Diagnosis not present

## 2022-06-24 DIAGNOSIS — M6281 Muscle weakness (generalized): Secondary | ICD-10-CM | POA: Diagnosis not present

## 2022-06-24 DIAGNOSIS — M25511 Pain in right shoulder: Secondary | ICD-10-CM | POA: Diagnosis not present

## 2022-06-24 DIAGNOSIS — M9903 Segmental and somatic dysfunction of lumbar region: Secondary | ICD-10-CM | POA: Diagnosis not present

## 2022-06-24 DIAGNOSIS — M9901 Segmental and somatic dysfunction of cervical region: Secondary | ICD-10-CM | POA: Diagnosis not present

## 2022-06-24 DIAGNOSIS — S335XXA Sprain of ligaments of lumbar spine, initial encounter: Secondary | ICD-10-CM | POA: Diagnosis not present

## 2022-06-24 DIAGNOSIS — Z471 Aftercare following joint replacement surgery: Secondary | ICD-10-CM | POA: Diagnosis not present

## 2022-06-28 DIAGNOSIS — Z471 Aftercare following joint replacement surgery: Secondary | ICD-10-CM | POA: Diagnosis not present

## 2022-06-28 DIAGNOSIS — M6281 Muscle weakness (generalized): Secondary | ICD-10-CM | POA: Diagnosis not present

## 2022-06-28 DIAGNOSIS — M25511 Pain in right shoulder: Secondary | ICD-10-CM | POA: Diagnosis not present

## 2022-07-02 DIAGNOSIS — Z471 Aftercare following joint replacement surgery: Secondary | ICD-10-CM | POA: Diagnosis not present

## 2022-07-02 DIAGNOSIS — M25511 Pain in right shoulder: Secondary | ICD-10-CM | POA: Diagnosis not present

## 2022-07-02 DIAGNOSIS — M6281 Muscle weakness (generalized): Secondary | ICD-10-CM | POA: Diagnosis not present

## 2022-07-04 DIAGNOSIS — M6281 Muscle weakness (generalized): Secondary | ICD-10-CM | POA: Diagnosis not present

## 2022-07-04 DIAGNOSIS — Z471 Aftercare following joint replacement surgery: Secondary | ICD-10-CM | POA: Diagnosis not present

## 2022-07-04 DIAGNOSIS — M25511 Pain in right shoulder: Secondary | ICD-10-CM | POA: Diagnosis not present

## 2022-07-05 DIAGNOSIS — M19011 Primary osteoarthritis, right shoulder: Secondary | ICD-10-CM | POA: Diagnosis not present

## 2022-07-05 DIAGNOSIS — M25562 Pain in left knee: Secondary | ICD-10-CM | POA: Diagnosis not present

## 2022-07-08 DIAGNOSIS — Z471 Aftercare following joint replacement surgery: Secondary | ICD-10-CM | POA: Diagnosis not present

## 2022-07-08 DIAGNOSIS — M6281 Muscle weakness (generalized): Secondary | ICD-10-CM | POA: Diagnosis not present

## 2022-07-08 DIAGNOSIS — M25511 Pain in right shoulder: Secondary | ICD-10-CM | POA: Diagnosis not present

## 2022-07-10 DIAGNOSIS — M9903 Segmental and somatic dysfunction of lumbar region: Secondary | ICD-10-CM | POA: Diagnosis not present

## 2022-07-10 DIAGNOSIS — S335XXA Sprain of ligaments of lumbar spine, initial encounter: Secondary | ICD-10-CM | POA: Diagnosis not present

## 2022-07-10 DIAGNOSIS — M9901 Segmental and somatic dysfunction of cervical region: Secondary | ICD-10-CM | POA: Diagnosis not present

## 2022-07-12 DIAGNOSIS — M6281 Muscle weakness (generalized): Secondary | ICD-10-CM | POA: Diagnosis not present

## 2022-07-12 DIAGNOSIS — Z471 Aftercare following joint replacement surgery: Secondary | ICD-10-CM | POA: Diagnosis not present

## 2022-07-12 DIAGNOSIS — M25511 Pain in right shoulder: Secondary | ICD-10-CM | POA: Diagnosis not present

## 2022-07-15 DIAGNOSIS — M6281 Muscle weakness (generalized): Secondary | ICD-10-CM | POA: Diagnosis not present

## 2022-07-15 DIAGNOSIS — M25511 Pain in right shoulder: Secondary | ICD-10-CM | POA: Diagnosis not present

## 2022-07-15 DIAGNOSIS — Z471 Aftercare following joint replacement surgery: Secondary | ICD-10-CM | POA: Diagnosis not present

## 2022-07-19 DIAGNOSIS — M6281 Muscle weakness (generalized): Secondary | ICD-10-CM | POA: Diagnosis not present

## 2022-07-19 DIAGNOSIS — Z471 Aftercare following joint replacement surgery: Secondary | ICD-10-CM | POA: Diagnosis not present

## 2022-07-19 DIAGNOSIS — M25511 Pain in right shoulder: Secondary | ICD-10-CM | POA: Diagnosis not present

## 2022-07-22 DIAGNOSIS — M25511 Pain in right shoulder: Secondary | ICD-10-CM | POA: Diagnosis not present

## 2022-07-22 DIAGNOSIS — Z471 Aftercare following joint replacement surgery: Secondary | ICD-10-CM | POA: Diagnosis not present

## 2022-07-22 DIAGNOSIS — M6281 Muscle weakness (generalized): Secondary | ICD-10-CM | POA: Diagnosis not present

## 2022-07-24 DIAGNOSIS — Z471 Aftercare following joint replacement surgery: Secondary | ICD-10-CM | POA: Diagnosis not present

## 2022-07-24 DIAGNOSIS — M6281 Muscle weakness (generalized): Secondary | ICD-10-CM | POA: Diagnosis not present

## 2022-07-24 DIAGNOSIS — M25511 Pain in right shoulder: Secondary | ICD-10-CM | POA: Diagnosis not present

## 2022-07-29 DIAGNOSIS — M6281 Muscle weakness (generalized): Secondary | ICD-10-CM | POA: Diagnosis not present

## 2022-07-29 DIAGNOSIS — Z471 Aftercare following joint replacement surgery: Secondary | ICD-10-CM | POA: Diagnosis not present

## 2022-07-29 DIAGNOSIS — M25511 Pain in right shoulder: Secondary | ICD-10-CM | POA: Diagnosis not present

## 2022-08-01 DIAGNOSIS — S335XXA Sprain of ligaments of lumbar spine, initial encounter: Secondary | ICD-10-CM | POA: Diagnosis not present

## 2022-08-01 DIAGNOSIS — M9903 Segmental and somatic dysfunction of lumbar region: Secondary | ICD-10-CM | POA: Diagnosis not present

## 2022-08-01 DIAGNOSIS — M9901 Segmental and somatic dysfunction of cervical region: Secondary | ICD-10-CM | POA: Diagnosis not present

## 2022-08-02 DIAGNOSIS — M25511 Pain in right shoulder: Secondary | ICD-10-CM | POA: Diagnosis not present

## 2022-08-02 DIAGNOSIS — M6281 Muscle weakness (generalized): Secondary | ICD-10-CM | POA: Diagnosis not present

## 2022-08-02 DIAGNOSIS — Z471 Aftercare following joint replacement surgery: Secondary | ICD-10-CM | POA: Diagnosis not present

## 2022-08-05 DIAGNOSIS — Z471 Aftercare following joint replacement surgery: Secondary | ICD-10-CM | POA: Diagnosis not present

## 2022-08-05 DIAGNOSIS — M25511 Pain in right shoulder: Secondary | ICD-10-CM | POA: Diagnosis not present

## 2022-08-05 DIAGNOSIS — M6281 Muscle weakness (generalized): Secondary | ICD-10-CM | POA: Diagnosis not present

## 2022-08-14 DIAGNOSIS — Z471 Aftercare following joint replacement surgery: Secondary | ICD-10-CM | POA: Diagnosis not present

## 2022-08-14 DIAGNOSIS — M6281 Muscle weakness (generalized): Secondary | ICD-10-CM | POA: Diagnosis not present

## 2022-08-14 DIAGNOSIS — M25511 Pain in right shoulder: Secondary | ICD-10-CM | POA: Diagnosis not present

## 2022-08-22 DIAGNOSIS — S335XXA Sprain of ligaments of lumbar spine, initial encounter: Secondary | ICD-10-CM | POA: Diagnosis not present

## 2022-08-22 DIAGNOSIS — M9903 Segmental and somatic dysfunction of lumbar region: Secondary | ICD-10-CM | POA: Diagnosis not present

## 2022-08-22 DIAGNOSIS — M9901 Segmental and somatic dysfunction of cervical region: Secondary | ICD-10-CM | POA: Diagnosis not present

## 2022-09-04 DIAGNOSIS — Z1283 Encounter for screening for malignant neoplasm of skin: Secondary | ICD-10-CM | POA: Diagnosis not present

## 2022-09-04 DIAGNOSIS — D239 Other benign neoplasm of skin, unspecified: Secondary | ICD-10-CM | POA: Diagnosis not present

## 2022-09-11 DIAGNOSIS — S335XXA Sprain of ligaments of lumbar spine, initial encounter: Secondary | ICD-10-CM | POA: Diagnosis not present

## 2022-09-11 DIAGNOSIS — M9901 Segmental and somatic dysfunction of cervical region: Secondary | ICD-10-CM | POA: Diagnosis not present

## 2022-09-11 DIAGNOSIS — M9903 Segmental and somatic dysfunction of lumbar region: Secondary | ICD-10-CM | POA: Diagnosis not present

## 2022-10-04 DIAGNOSIS — M1711 Unilateral primary osteoarthritis, right knee: Secondary | ICD-10-CM | POA: Diagnosis not present

## 2022-10-04 DIAGNOSIS — M19011 Primary osteoarthritis, right shoulder: Secondary | ICD-10-CM | POA: Diagnosis not present

## 2022-10-09 DIAGNOSIS — M9904 Segmental and somatic dysfunction of sacral region: Secondary | ICD-10-CM | POA: Diagnosis not present

## 2022-10-09 DIAGNOSIS — M9903 Segmental and somatic dysfunction of lumbar region: Secondary | ICD-10-CM | POA: Diagnosis not present

## 2022-10-09 DIAGNOSIS — S335XXA Sprain of ligaments of lumbar spine, initial encounter: Secondary | ICD-10-CM | POA: Diagnosis not present

## 2022-10-09 DIAGNOSIS — M9901 Segmental and somatic dysfunction of cervical region: Secondary | ICD-10-CM | POA: Diagnosis not present

## 2022-11-04 DIAGNOSIS — R030 Elevated blood-pressure reading, without diagnosis of hypertension: Secondary | ICD-10-CM | POA: Diagnosis not present

## 2022-11-04 DIAGNOSIS — M1632 Unilateral osteoarthritis resulting from hip dysplasia, left hip: Secondary | ICD-10-CM | POA: Diagnosis not present

## 2022-11-04 DIAGNOSIS — M419 Scoliosis, unspecified: Secondary | ICD-10-CM | POA: Diagnosis not present

## 2022-11-04 DIAGNOSIS — M159 Polyosteoarthritis, unspecified: Secondary | ICD-10-CM | POA: Diagnosis not present

## 2022-11-04 DIAGNOSIS — N1831 Chronic kidney disease, stage 3a: Secondary | ICD-10-CM | POA: Diagnosis not present

## 2022-11-04 DIAGNOSIS — M3505 Sjogren syndrome with inflammatory arthritis: Secondary | ICD-10-CM | POA: Diagnosis not present

## 2022-11-04 DIAGNOSIS — M4316 Spondylolisthesis, lumbar region: Secondary | ICD-10-CM | POA: Diagnosis not present

## 2022-11-04 DIAGNOSIS — Z6820 Body mass index (BMI) 20.0-20.9, adult: Secondary | ICD-10-CM | POA: Diagnosis not present

## 2022-11-06 DIAGNOSIS — M9904 Segmental and somatic dysfunction of sacral region: Secondary | ICD-10-CM | POA: Diagnosis not present

## 2022-11-06 DIAGNOSIS — M9903 Segmental and somatic dysfunction of lumbar region: Secondary | ICD-10-CM | POA: Diagnosis not present

## 2022-11-06 DIAGNOSIS — M9901 Segmental and somatic dysfunction of cervical region: Secondary | ICD-10-CM | POA: Diagnosis not present

## 2022-11-06 DIAGNOSIS — S335XXA Sprain of ligaments of lumbar spine, initial encounter: Secondary | ICD-10-CM | POA: Diagnosis not present

## 2022-11-18 DIAGNOSIS — M545 Low back pain, unspecified: Secondary | ICD-10-CM | POA: Diagnosis not present

## 2022-11-18 DIAGNOSIS — M25552 Pain in left hip: Secondary | ICD-10-CM | POA: Diagnosis not present

## 2022-11-18 DIAGNOSIS — M5136 Other intervertebral disc degeneration, lumbar region: Secondary | ICD-10-CM | POA: Diagnosis not present

## 2022-11-25 DIAGNOSIS — M25552 Pain in left hip: Secondary | ICD-10-CM | POA: Diagnosis not present

## 2022-11-25 DIAGNOSIS — M545 Low back pain, unspecified: Secondary | ICD-10-CM | POA: Diagnosis not present

## 2022-11-25 DIAGNOSIS — M5136 Other intervertebral disc degeneration, lumbar region: Secondary | ICD-10-CM | POA: Diagnosis not present

## 2022-12-02 DIAGNOSIS — M25552 Pain in left hip: Secondary | ICD-10-CM | POA: Diagnosis not present

## 2022-12-02 DIAGNOSIS — M5136 Other intervertebral disc degeneration, lumbar region: Secondary | ICD-10-CM | POA: Diagnosis not present

## 2022-12-02 DIAGNOSIS — M545 Low back pain, unspecified: Secondary | ICD-10-CM | POA: Diagnosis not present

## 2022-12-04 DIAGNOSIS — M9903 Segmental and somatic dysfunction of lumbar region: Secondary | ICD-10-CM | POA: Diagnosis not present

## 2022-12-04 DIAGNOSIS — M9901 Segmental and somatic dysfunction of cervical region: Secondary | ICD-10-CM | POA: Diagnosis not present

## 2022-12-04 DIAGNOSIS — S335XXA Sprain of ligaments of lumbar spine, initial encounter: Secondary | ICD-10-CM | POA: Diagnosis not present

## 2022-12-04 DIAGNOSIS — M9904 Segmental and somatic dysfunction of sacral region: Secondary | ICD-10-CM | POA: Diagnosis not present

## 2022-12-04 DIAGNOSIS — M5136 Other intervertebral disc degeneration, lumbar region: Secondary | ICD-10-CM | POA: Diagnosis not present

## 2022-12-04 DIAGNOSIS — M545 Low back pain, unspecified: Secondary | ICD-10-CM | POA: Diagnosis not present

## 2022-12-04 DIAGNOSIS — M25552 Pain in left hip: Secondary | ICD-10-CM | POA: Diagnosis not present

## 2022-12-09 DIAGNOSIS — M5136 Other intervertebral disc degeneration, lumbar region: Secondary | ICD-10-CM | POA: Diagnosis not present

## 2022-12-09 DIAGNOSIS — M545 Low back pain, unspecified: Secondary | ICD-10-CM | POA: Diagnosis not present

## 2022-12-09 DIAGNOSIS — M25552 Pain in left hip: Secondary | ICD-10-CM | POA: Diagnosis not present

## 2022-12-12 DIAGNOSIS — M5136 Other intervertebral disc degeneration, lumbar region: Secondary | ICD-10-CM | POA: Diagnosis not present

## 2022-12-12 DIAGNOSIS — M25552 Pain in left hip: Secondary | ICD-10-CM | POA: Diagnosis not present

## 2022-12-12 DIAGNOSIS — M545 Low back pain, unspecified: Secondary | ICD-10-CM | POA: Diagnosis not present

## 2022-12-16 DIAGNOSIS — M5136 Other intervertebral disc degeneration, lumbar region: Secondary | ICD-10-CM | POA: Diagnosis not present

## 2022-12-16 DIAGNOSIS — M25552 Pain in left hip: Secondary | ICD-10-CM | POA: Diagnosis not present

## 2022-12-16 DIAGNOSIS — M545 Low back pain, unspecified: Secondary | ICD-10-CM | POA: Diagnosis not present

## 2022-12-20 DIAGNOSIS — R7303 Prediabetes: Secondary | ICD-10-CM | POA: Diagnosis not present

## 2022-12-20 DIAGNOSIS — M419 Scoliosis, unspecified: Secondary | ICD-10-CM | POA: Diagnosis not present

## 2022-12-20 DIAGNOSIS — E7801 Familial hypercholesterolemia: Secondary | ICD-10-CM | POA: Diagnosis not present

## 2022-12-20 DIAGNOSIS — E559 Vitamin D deficiency, unspecified: Secondary | ICD-10-CM | POA: Diagnosis not present

## 2022-12-20 DIAGNOSIS — N1831 Chronic kidney disease, stage 3a: Secondary | ICD-10-CM | POA: Diagnosis not present

## 2022-12-20 DIAGNOSIS — Z1329 Encounter for screening for other suspected endocrine disorder: Secondary | ICD-10-CM | POA: Diagnosis not present

## 2022-12-23 DIAGNOSIS — M5136 Other intervertebral disc degeneration, lumbar region: Secondary | ICD-10-CM | POA: Diagnosis not present

## 2022-12-23 DIAGNOSIS — M25552 Pain in left hip: Secondary | ICD-10-CM | POA: Diagnosis not present

## 2022-12-23 DIAGNOSIS — M545 Low back pain, unspecified: Secondary | ICD-10-CM | POA: Diagnosis not present

## 2022-12-25 DIAGNOSIS — R7989 Other specified abnormal findings of blood chemistry: Secondary | ICD-10-CM | POA: Diagnosis not present

## 2022-12-25 DIAGNOSIS — Z1331 Encounter for screening for depression: Secondary | ICD-10-CM | POA: Diagnosis not present

## 2022-12-25 DIAGNOSIS — R7303 Prediabetes: Secondary | ICD-10-CM | POA: Diagnosis not present

## 2022-12-25 DIAGNOSIS — D649 Anemia, unspecified: Secondary | ICD-10-CM | POA: Diagnosis not present

## 2022-12-25 DIAGNOSIS — M1632 Unilateral osteoarthritis resulting from hip dysplasia, left hip: Secondary | ICD-10-CM | POA: Diagnosis not present

## 2022-12-25 DIAGNOSIS — M4316 Spondylolisthesis, lumbar region: Secondary | ICD-10-CM | POA: Diagnosis not present

## 2022-12-25 DIAGNOSIS — Z1389 Encounter for screening for other disorder: Secondary | ICD-10-CM | POA: Diagnosis not present

## 2022-12-25 DIAGNOSIS — Z23 Encounter for immunization: Secondary | ICD-10-CM | POA: Diagnosis not present

## 2022-12-25 DIAGNOSIS — N1832 Chronic kidney disease, stage 3b: Secondary | ICD-10-CM | POA: Diagnosis not present

## 2022-12-25 DIAGNOSIS — Z0001 Encounter for general adult medical examination with abnormal findings: Secondary | ICD-10-CM | POA: Diagnosis not present

## 2022-12-25 DIAGNOSIS — M3505 Sjogren syndrome with inflammatory arthritis: Secondary | ICD-10-CM | POA: Diagnosis not present

## 2022-12-25 DIAGNOSIS — M159 Polyosteoarthritis, unspecified: Secondary | ICD-10-CM | POA: Diagnosis not present

## 2022-12-30 DIAGNOSIS — M5136 Other intervertebral disc degeneration, lumbar region: Secondary | ICD-10-CM | POA: Diagnosis not present

## 2022-12-30 DIAGNOSIS — M545 Low back pain, unspecified: Secondary | ICD-10-CM | POA: Diagnosis not present

## 2022-12-30 DIAGNOSIS — M25552 Pain in left hip: Secondary | ICD-10-CM | POA: Diagnosis not present

## 2022-12-31 DIAGNOSIS — R7989 Other specified abnormal findings of blood chemistry: Secondary | ICD-10-CM | POA: Diagnosis not present

## 2023-01-06 DIAGNOSIS — M545 Low back pain, unspecified: Secondary | ICD-10-CM | POA: Diagnosis not present

## 2023-01-06 DIAGNOSIS — M9904 Segmental and somatic dysfunction of sacral region: Secondary | ICD-10-CM | POA: Diagnosis not present

## 2023-01-06 DIAGNOSIS — M25552 Pain in left hip: Secondary | ICD-10-CM | POA: Diagnosis not present

## 2023-01-06 DIAGNOSIS — M9901 Segmental and somatic dysfunction of cervical region: Secondary | ICD-10-CM | POA: Diagnosis not present

## 2023-01-06 DIAGNOSIS — M9903 Segmental and somatic dysfunction of lumbar region: Secondary | ICD-10-CM | POA: Diagnosis not present

## 2023-01-06 DIAGNOSIS — S335XXA Sprain of ligaments of lumbar spine, initial encounter: Secondary | ICD-10-CM | POA: Diagnosis not present

## 2023-01-06 DIAGNOSIS — M5136 Other intervertebral disc degeneration, lumbar region: Secondary | ICD-10-CM | POA: Diagnosis not present

## 2023-01-08 DIAGNOSIS — R748 Abnormal levels of other serum enzymes: Secondary | ICD-10-CM | POA: Diagnosis not present

## 2023-01-08 DIAGNOSIS — M1991 Primary osteoarthritis, unspecified site: Secondary | ICD-10-CM | POA: Diagnosis not present

## 2023-01-08 DIAGNOSIS — R29898 Other symptoms and signs involving the musculoskeletal system: Secondary | ICD-10-CM | POA: Diagnosis not present

## 2023-01-08 DIAGNOSIS — M5136 Other intervertebral disc degeneration, lumbar region: Secondary | ICD-10-CM | POA: Diagnosis not present

## 2023-01-08 DIAGNOSIS — M3501 Sicca syndrome with keratoconjunctivitis: Secondary | ICD-10-CM | POA: Diagnosis not present

## 2023-01-08 DIAGNOSIS — Z681 Body mass index (BMI) 19 or less, adult: Secondary | ICD-10-CM | POA: Diagnosis not present

## 2023-01-08 DIAGNOSIS — R7989 Other specified abnormal findings of blood chemistry: Secondary | ICD-10-CM | POA: Diagnosis not present

## 2023-01-13 DIAGNOSIS — M5136 Other intervertebral disc degeneration, lumbar region: Secondary | ICD-10-CM | POA: Diagnosis not present

## 2023-01-13 DIAGNOSIS — M545 Low back pain, unspecified: Secondary | ICD-10-CM | POA: Diagnosis not present

## 2023-01-13 DIAGNOSIS — M25552 Pain in left hip: Secondary | ICD-10-CM | POA: Diagnosis not present

## 2023-01-15 DIAGNOSIS — M25552 Pain in left hip: Secondary | ICD-10-CM | POA: Diagnosis not present

## 2023-01-15 DIAGNOSIS — M545 Low back pain, unspecified: Secondary | ICD-10-CM | POA: Diagnosis not present

## 2023-01-23 DIAGNOSIS — M5136 Other intervertebral disc degeneration, lumbar region: Secondary | ICD-10-CM | POA: Diagnosis not present

## 2023-01-23 DIAGNOSIS — M48061 Spinal stenosis, lumbar region without neurogenic claudication: Secondary | ICD-10-CM | POA: Diagnosis not present

## 2023-01-23 DIAGNOSIS — M5135 Other intervertebral disc degeneration, thoracolumbar region: Secondary | ICD-10-CM | POA: Diagnosis not present

## 2023-01-23 DIAGNOSIS — M47816 Spondylosis without myelopathy or radiculopathy, lumbar region: Secondary | ICD-10-CM | POA: Diagnosis not present

## 2023-01-23 DIAGNOSIS — M545 Low back pain, unspecified: Secondary | ICD-10-CM | POA: Diagnosis not present

## 2023-02-03 DIAGNOSIS — M9903 Segmental and somatic dysfunction of lumbar region: Secondary | ICD-10-CM | POA: Diagnosis not present

## 2023-02-03 DIAGNOSIS — M9901 Segmental and somatic dysfunction of cervical region: Secondary | ICD-10-CM | POA: Diagnosis not present

## 2023-02-03 DIAGNOSIS — M9904 Segmental and somatic dysfunction of sacral region: Secondary | ICD-10-CM | POA: Diagnosis not present

## 2023-02-03 DIAGNOSIS — S335XXA Sprain of ligaments of lumbar spine, initial encounter: Secondary | ICD-10-CM | POA: Diagnosis not present

## 2023-02-04 DIAGNOSIS — H9190 Unspecified hearing loss, unspecified ear: Secondary | ICD-10-CM | POA: Diagnosis not present

## 2023-02-04 DIAGNOSIS — H6120 Impacted cerumen, unspecified ear: Secondary | ICD-10-CM | POA: Diagnosis not present

## 2023-02-06 DIAGNOSIS — M25552 Pain in left hip: Secondary | ICD-10-CM | POA: Diagnosis not present

## 2023-02-10 DIAGNOSIS — H04123 Dry eye syndrome of bilateral lacrimal glands: Secondary | ICD-10-CM | POA: Diagnosis not present

## 2023-02-14 DIAGNOSIS — M25552 Pain in left hip: Secondary | ICD-10-CM | POA: Diagnosis not present

## 2023-02-17 DIAGNOSIS — M6281 Muscle weakness (generalized): Secondary | ICD-10-CM | POA: Diagnosis not present

## 2023-02-17 DIAGNOSIS — M25552 Pain in left hip: Secondary | ICD-10-CM | POA: Diagnosis not present

## 2023-02-17 DIAGNOSIS — M545 Low back pain, unspecified: Secondary | ICD-10-CM | POA: Diagnosis not present

## 2023-02-17 DIAGNOSIS — R262 Difficulty in walking, not elsewhere classified: Secondary | ICD-10-CM | POA: Diagnosis not present

## 2023-02-20 DIAGNOSIS — M25552 Pain in left hip: Secondary | ICD-10-CM | POA: Diagnosis not present

## 2023-02-20 DIAGNOSIS — M545 Low back pain, unspecified: Secondary | ICD-10-CM | POA: Diagnosis not present

## 2023-02-20 DIAGNOSIS — R262 Difficulty in walking, not elsewhere classified: Secondary | ICD-10-CM | POA: Diagnosis not present

## 2023-02-20 DIAGNOSIS — M6281 Muscle weakness (generalized): Secondary | ICD-10-CM | POA: Diagnosis not present

## 2023-02-24 DIAGNOSIS — M545 Low back pain, unspecified: Secondary | ICD-10-CM | POA: Diagnosis not present

## 2023-02-24 DIAGNOSIS — M25552 Pain in left hip: Secondary | ICD-10-CM | POA: Diagnosis not present

## 2023-02-24 DIAGNOSIS — R262 Difficulty in walking, not elsewhere classified: Secondary | ICD-10-CM | POA: Diagnosis not present

## 2023-02-24 DIAGNOSIS — M6281 Muscle weakness (generalized): Secondary | ICD-10-CM | POA: Diagnosis not present

## 2023-02-27 DIAGNOSIS — M25552 Pain in left hip: Secondary | ICD-10-CM | POA: Diagnosis not present

## 2023-02-27 DIAGNOSIS — M6281 Muscle weakness (generalized): Secondary | ICD-10-CM | POA: Diagnosis not present

## 2023-02-27 DIAGNOSIS — M545 Low back pain, unspecified: Secondary | ICD-10-CM | POA: Diagnosis not present

## 2023-02-27 DIAGNOSIS — R262 Difficulty in walking, not elsewhere classified: Secondary | ICD-10-CM | POA: Diagnosis not present

## 2023-03-03 DIAGNOSIS — R262 Difficulty in walking, not elsewhere classified: Secondary | ICD-10-CM | POA: Diagnosis not present

## 2023-03-03 DIAGNOSIS — M6281 Muscle weakness (generalized): Secondary | ICD-10-CM | POA: Diagnosis not present

## 2023-03-03 DIAGNOSIS — M9901 Segmental and somatic dysfunction of cervical region: Secondary | ICD-10-CM | POA: Diagnosis not present

## 2023-03-03 DIAGNOSIS — M9903 Segmental and somatic dysfunction of lumbar region: Secondary | ICD-10-CM | POA: Diagnosis not present

## 2023-03-03 DIAGNOSIS — M545 Low back pain, unspecified: Secondary | ICD-10-CM | POA: Diagnosis not present

## 2023-03-03 DIAGNOSIS — M25552 Pain in left hip: Secondary | ICD-10-CM | POA: Diagnosis not present

## 2023-03-03 DIAGNOSIS — M9904 Segmental and somatic dysfunction of sacral region: Secondary | ICD-10-CM | POA: Diagnosis not present

## 2023-03-03 DIAGNOSIS — S335XXA Sprain of ligaments of lumbar spine, initial encounter: Secondary | ICD-10-CM | POA: Diagnosis not present

## 2023-03-06 DIAGNOSIS — R262 Difficulty in walking, not elsewhere classified: Secondary | ICD-10-CM | POA: Diagnosis not present

## 2023-03-06 DIAGNOSIS — M545 Low back pain, unspecified: Secondary | ICD-10-CM | POA: Diagnosis not present

## 2023-03-06 DIAGNOSIS — M6281 Muscle weakness (generalized): Secondary | ICD-10-CM | POA: Diagnosis not present

## 2023-03-06 DIAGNOSIS — M25552 Pain in left hip: Secondary | ICD-10-CM | POA: Diagnosis not present

## 2023-03-10 DIAGNOSIS — R262 Difficulty in walking, not elsewhere classified: Secondary | ICD-10-CM | POA: Diagnosis not present

## 2023-03-10 DIAGNOSIS — M545 Low back pain, unspecified: Secondary | ICD-10-CM | POA: Diagnosis not present

## 2023-03-10 DIAGNOSIS — M25552 Pain in left hip: Secondary | ICD-10-CM | POA: Diagnosis not present

## 2023-03-10 DIAGNOSIS — M6281 Muscle weakness (generalized): Secondary | ICD-10-CM | POA: Diagnosis not present

## 2023-03-17 DIAGNOSIS — R262 Difficulty in walking, not elsewhere classified: Secondary | ICD-10-CM | POA: Diagnosis not present

## 2023-03-17 DIAGNOSIS — M545 Low back pain, unspecified: Secondary | ICD-10-CM | POA: Diagnosis not present

## 2023-03-17 DIAGNOSIS — M25552 Pain in left hip: Secondary | ICD-10-CM | POA: Diagnosis not present

## 2023-03-17 DIAGNOSIS — M6281 Muscle weakness (generalized): Secondary | ICD-10-CM | POA: Diagnosis not present

## 2023-03-18 DIAGNOSIS — M4120 Other idiopathic scoliosis, site unspecified: Secondary | ICD-10-CM | POA: Diagnosis not present

## 2023-03-18 DIAGNOSIS — M25552 Pain in left hip: Secondary | ICD-10-CM | POA: Diagnosis not present

## 2023-03-24 DIAGNOSIS — M545 Low back pain, unspecified: Secondary | ICD-10-CM | POA: Diagnosis not present

## 2023-03-24 DIAGNOSIS — R262 Difficulty in walking, not elsewhere classified: Secondary | ICD-10-CM | POA: Diagnosis not present

## 2023-03-24 DIAGNOSIS — M25552 Pain in left hip: Secondary | ICD-10-CM | POA: Diagnosis not present

## 2023-03-24 DIAGNOSIS — M6281 Muscle weakness (generalized): Secondary | ICD-10-CM | POA: Diagnosis not present

## 2023-03-28 DIAGNOSIS — M1990 Unspecified osteoarthritis, unspecified site: Secondary | ICD-10-CM | POA: Diagnosis not present

## 2023-03-28 DIAGNOSIS — R7303 Prediabetes: Secondary | ICD-10-CM | POA: Diagnosis not present

## 2023-03-28 DIAGNOSIS — R7989 Other specified abnormal findings of blood chemistry: Secondary | ICD-10-CM | POA: Diagnosis not present

## 2023-03-28 DIAGNOSIS — N1832 Chronic kidney disease, stage 3b: Secondary | ICD-10-CM | POA: Diagnosis not present

## 2023-03-28 DIAGNOSIS — D72819 Decreased white blood cell count, unspecified: Secondary | ICD-10-CM | POA: Diagnosis not present

## 2023-03-31 DIAGNOSIS — M6281 Muscle weakness (generalized): Secondary | ICD-10-CM | POA: Diagnosis not present

## 2023-03-31 DIAGNOSIS — M25552 Pain in left hip: Secondary | ICD-10-CM | POA: Diagnosis not present

## 2023-03-31 DIAGNOSIS — M545 Low back pain, unspecified: Secondary | ICD-10-CM | POA: Diagnosis not present

## 2023-03-31 DIAGNOSIS — R262 Difficulty in walking, not elsewhere classified: Secondary | ICD-10-CM | POA: Diagnosis not present

## 2023-04-01 DIAGNOSIS — M9903 Segmental and somatic dysfunction of lumbar region: Secondary | ICD-10-CM | POA: Diagnosis not present

## 2023-04-01 DIAGNOSIS — M9901 Segmental and somatic dysfunction of cervical region: Secondary | ICD-10-CM | POA: Diagnosis not present

## 2023-04-01 DIAGNOSIS — S335XXA Sprain of ligaments of lumbar spine, initial encounter: Secondary | ICD-10-CM | POA: Diagnosis not present

## 2023-04-01 DIAGNOSIS — M9904 Segmental and somatic dysfunction of sacral region: Secondary | ICD-10-CM | POA: Diagnosis not present

## 2023-04-02 DIAGNOSIS — M419 Scoliosis, unspecified: Secondary | ICD-10-CM | POA: Diagnosis not present

## 2023-04-02 DIAGNOSIS — R7989 Other specified abnormal findings of blood chemistry: Secondary | ICD-10-CM | POA: Diagnosis not present

## 2023-04-02 DIAGNOSIS — M1632 Unilateral osteoarthritis resulting from hip dysplasia, left hip: Secondary | ICD-10-CM | POA: Diagnosis not present

## 2023-04-02 DIAGNOSIS — N1832 Chronic kidney disease, stage 3b: Secondary | ICD-10-CM | POA: Diagnosis not present

## 2023-04-02 DIAGNOSIS — M3505 Sjogren syndrome with inflammatory arthritis: Secondary | ICD-10-CM | POA: Diagnosis not present

## 2023-04-02 DIAGNOSIS — R7303 Prediabetes: Secondary | ICD-10-CM | POA: Diagnosis not present

## 2023-04-02 DIAGNOSIS — M159 Polyosteoarthritis, unspecified: Secondary | ICD-10-CM | POA: Diagnosis not present

## 2023-04-02 DIAGNOSIS — R030 Elevated blood-pressure reading, without diagnosis of hypertension: Secondary | ICD-10-CM | POA: Diagnosis not present

## 2023-04-02 DIAGNOSIS — M4316 Spondylolisthesis, lumbar region: Secondary | ICD-10-CM | POA: Diagnosis not present

## 2023-04-02 DIAGNOSIS — Z681 Body mass index (BMI) 19 or less, adult: Secondary | ICD-10-CM | POA: Diagnosis not present

## 2023-04-07 DIAGNOSIS — M545 Low back pain, unspecified: Secondary | ICD-10-CM | POA: Diagnosis not present

## 2023-04-07 DIAGNOSIS — R262 Difficulty in walking, not elsewhere classified: Secondary | ICD-10-CM | POA: Diagnosis not present

## 2023-04-07 DIAGNOSIS — M6281 Muscle weakness (generalized): Secondary | ICD-10-CM | POA: Diagnosis not present

## 2023-04-07 DIAGNOSIS — M25552 Pain in left hip: Secondary | ICD-10-CM | POA: Diagnosis not present

## 2023-04-14 DIAGNOSIS — M25552 Pain in left hip: Secondary | ICD-10-CM | POA: Diagnosis not present

## 2023-04-14 DIAGNOSIS — R262 Difficulty in walking, not elsewhere classified: Secondary | ICD-10-CM | POA: Diagnosis not present

## 2023-04-14 DIAGNOSIS — M545 Low back pain, unspecified: Secondary | ICD-10-CM | POA: Diagnosis not present

## 2023-04-14 DIAGNOSIS — M6281 Muscle weakness (generalized): Secondary | ICD-10-CM | POA: Diagnosis not present

## 2023-04-21 DIAGNOSIS — M1991 Primary osteoarthritis, unspecified site: Secondary | ICD-10-CM | POA: Diagnosis not present

## 2023-04-21 DIAGNOSIS — R748 Abnormal levels of other serum enzymes: Secondary | ICD-10-CM | POA: Diagnosis not present

## 2023-04-21 DIAGNOSIS — M3501 Sicca syndrome with keratoconjunctivitis: Secondary | ICD-10-CM | POA: Diagnosis not present

## 2023-04-21 DIAGNOSIS — M4727 Other spondylosis with radiculopathy, lumbosacral region: Secondary | ICD-10-CM | POA: Diagnosis not present

## 2023-04-21 DIAGNOSIS — Z681 Body mass index (BMI) 19 or less, adult: Secondary | ICD-10-CM | POA: Diagnosis not present

## 2023-04-21 DIAGNOSIS — R29898 Other symptoms and signs involving the musculoskeletal system: Secondary | ICD-10-CM | POA: Diagnosis not present

## 2023-04-22 DIAGNOSIS — M6281 Muscle weakness (generalized): Secondary | ICD-10-CM | POA: Diagnosis not present

## 2023-04-22 DIAGNOSIS — M25552 Pain in left hip: Secondary | ICD-10-CM | POA: Diagnosis not present

## 2023-04-22 DIAGNOSIS — M545 Low back pain, unspecified: Secondary | ICD-10-CM | POA: Diagnosis not present

## 2023-04-22 DIAGNOSIS — R262 Difficulty in walking, not elsewhere classified: Secondary | ICD-10-CM | POA: Diagnosis not present

## 2023-04-28 DIAGNOSIS — M6281 Muscle weakness (generalized): Secondary | ICD-10-CM | POA: Diagnosis not present

## 2023-04-28 DIAGNOSIS — M545 Low back pain, unspecified: Secondary | ICD-10-CM | POA: Diagnosis not present

## 2023-04-28 DIAGNOSIS — R262 Difficulty in walking, not elsewhere classified: Secondary | ICD-10-CM | POA: Diagnosis not present

## 2023-04-28 DIAGNOSIS — M25552 Pain in left hip: Secondary | ICD-10-CM | POA: Diagnosis not present

## 2023-04-30 DIAGNOSIS — S335XXA Sprain of ligaments of lumbar spine, initial encounter: Secondary | ICD-10-CM | POA: Diagnosis not present

## 2023-04-30 DIAGNOSIS — M9903 Segmental and somatic dysfunction of lumbar region: Secondary | ICD-10-CM | POA: Diagnosis not present

## 2023-04-30 DIAGNOSIS — M9904 Segmental and somatic dysfunction of sacral region: Secondary | ICD-10-CM | POA: Diagnosis not present

## 2023-04-30 DIAGNOSIS — M9901 Segmental and somatic dysfunction of cervical region: Secondary | ICD-10-CM | POA: Diagnosis not present

## 2023-05-14 IMAGING — DX DG KNEE 1-2V PORT*R*
2 series · 2 of 2 positions shown · non-contrast
Comparison: None.

CLINICAL DATA: Right knee arthroplasty

EXAM:
PORTABLE RIGHT KNEE - 1-2 VIEW

[knee lat]
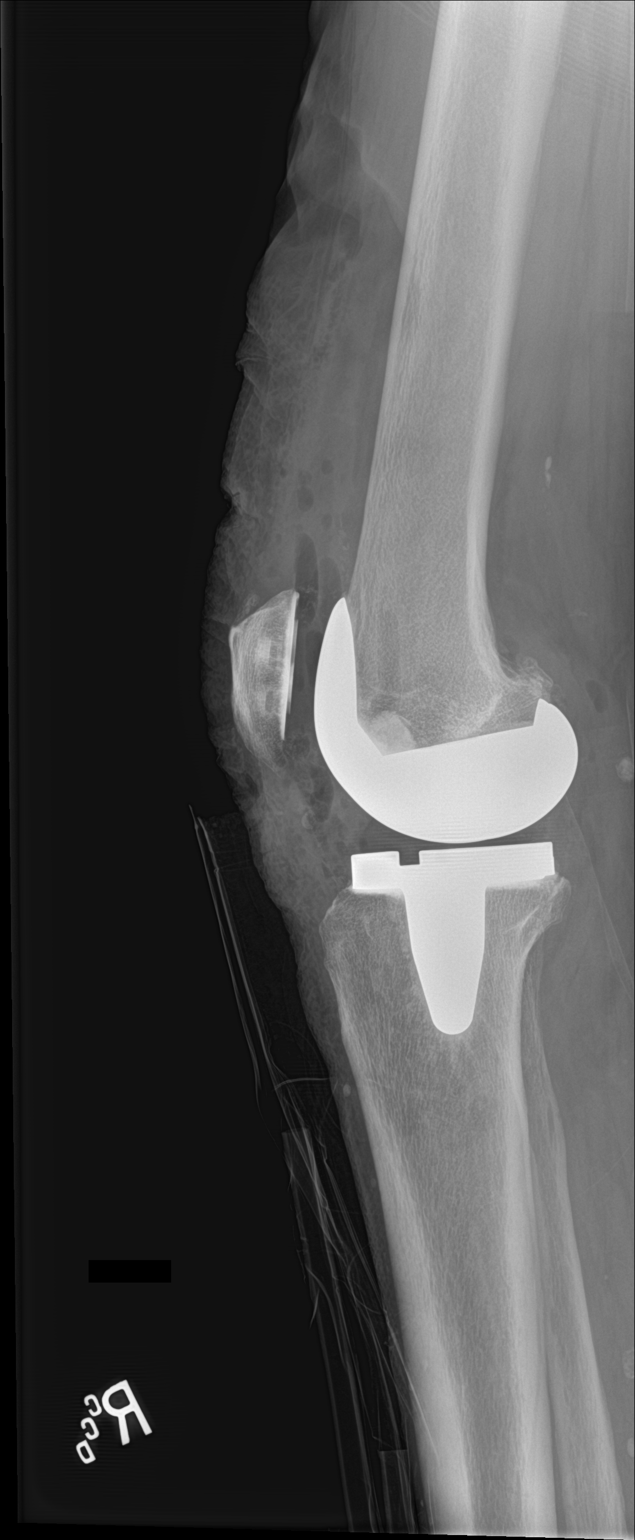

[knee ap]
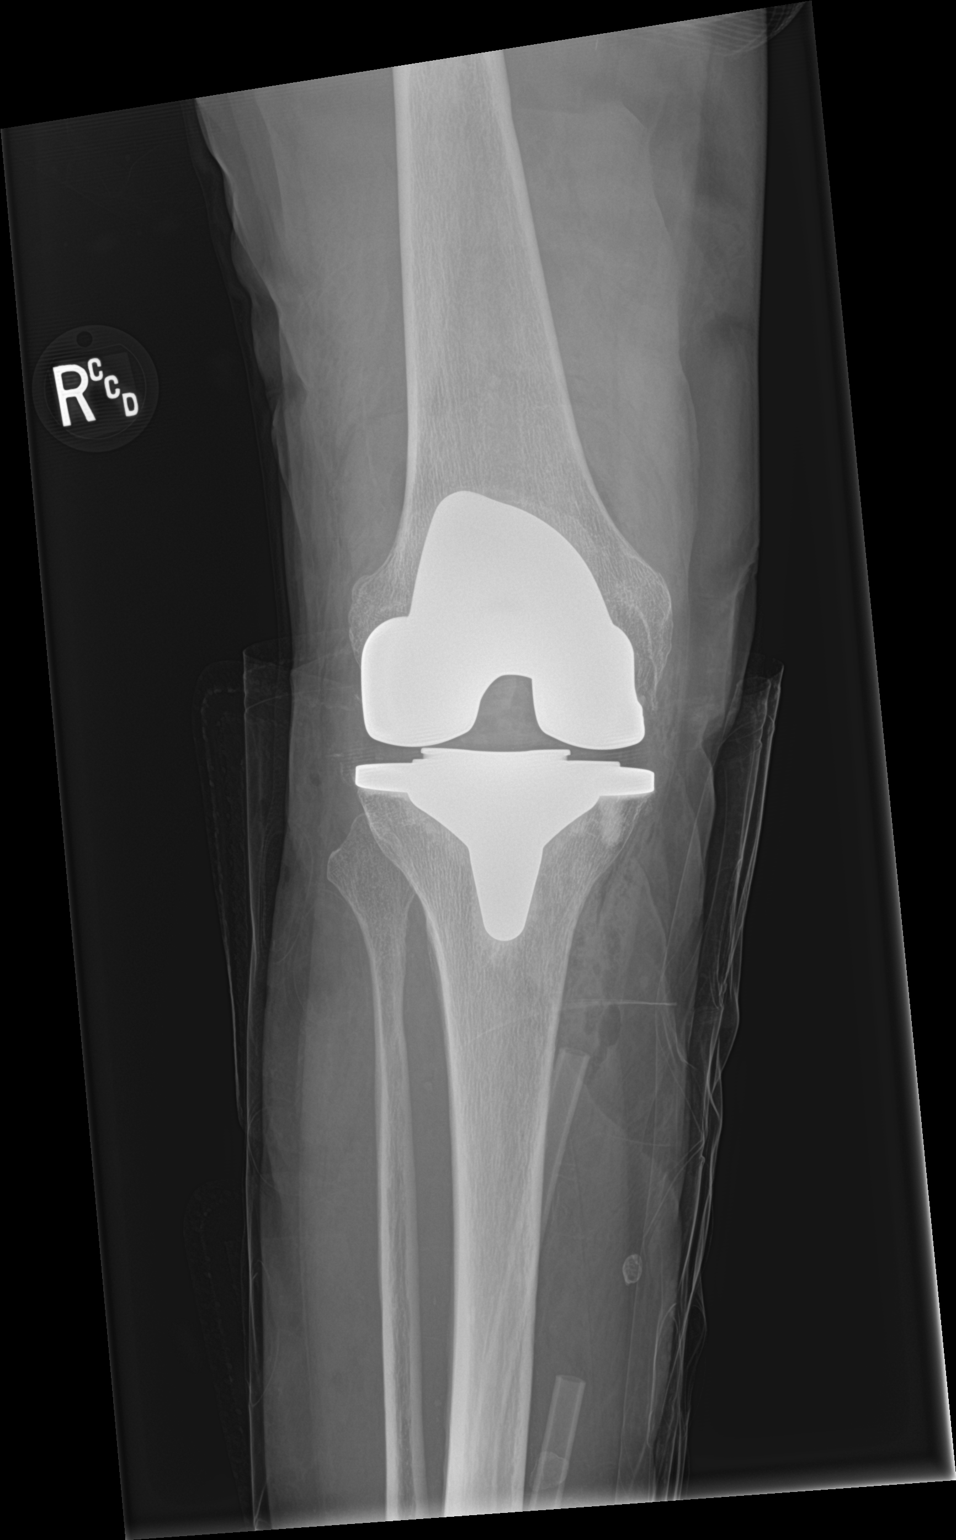

[2 of 2 positions shown; findings below may reference images not displayed]

FINDINGS: There is evidence of recent right knee arthroplasty. There are
pockets of air in the soft tissues. No fracture or dislocation is
seen.
IMPRESSION: Status post right knee arthroplasty.

## 2023-05-29 DIAGNOSIS — S335XXA Sprain of ligaments of lumbar spine, initial encounter: Secondary | ICD-10-CM | POA: Diagnosis not present

## 2023-05-29 DIAGNOSIS — M9903 Segmental and somatic dysfunction of lumbar region: Secondary | ICD-10-CM | POA: Diagnosis not present

## 2023-05-29 DIAGNOSIS — M9901 Segmental and somatic dysfunction of cervical region: Secondary | ICD-10-CM | POA: Diagnosis not present

## 2023-05-29 DIAGNOSIS — M9904 Segmental and somatic dysfunction of sacral region: Secondary | ICD-10-CM | POA: Diagnosis not present

## 2023-06-16 DIAGNOSIS — M25552 Pain in left hip: Secondary | ICD-10-CM | POA: Diagnosis not present

## 2023-06-20 DIAGNOSIS — M1612 Unilateral primary osteoarthritis, left hip: Secondary | ICD-10-CM | POA: Diagnosis not present

## 2023-06-25 DIAGNOSIS — M9903 Segmental and somatic dysfunction of lumbar region: Secondary | ICD-10-CM | POA: Diagnosis not present

## 2023-06-25 DIAGNOSIS — M9901 Segmental and somatic dysfunction of cervical region: Secondary | ICD-10-CM | POA: Diagnosis not present

## 2023-06-25 DIAGNOSIS — M9904 Segmental and somatic dysfunction of sacral region: Secondary | ICD-10-CM | POA: Diagnosis not present

## 2023-06-25 DIAGNOSIS — S335XXA Sprain of ligaments of lumbar spine, initial encounter: Secondary | ICD-10-CM | POA: Diagnosis not present

## 2023-07-04 DIAGNOSIS — E7800 Pure hypercholesterolemia, unspecified: Secondary | ICD-10-CM | POA: Diagnosis not present

## 2023-07-04 DIAGNOSIS — E7801 Familial hypercholesterolemia: Secondary | ICD-10-CM | POA: Diagnosis not present

## 2023-07-04 DIAGNOSIS — N1831 Chronic kidney disease, stage 3a: Secondary | ICD-10-CM | POA: Diagnosis not present

## 2023-07-04 DIAGNOSIS — Z1329 Encounter for screening for other suspected endocrine disorder: Secondary | ICD-10-CM | POA: Diagnosis not present

## 2023-07-04 DIAGNOSIS — I10 Essential (primary) hypertension: Secondary | ICD-10-CM | POA: Diagnosis not present

## 2023-07-04 DIAGNOSIS — Z131 Encounter for screening for diabetes mellitus: Secondary | ICD-10-CM | POA: Diagnosis not present

## 2023-07-10 DIAGNOSIS — Z01818 Encounter for other preprocedural examination: Secondary | ICD-10-CM | POA: Diagnosis not present

## 2023-07-10 DIAGNOSIS — D649 Anemia, unspecified: Secondary | ICD-10-CM | POA: Diagnosis not present

## 2023-07-10 DIAGNOSIS — N1831 Chronic kidney disease, stage 3a: Secondary | ICD-10-CM | POA: Diagnosis not present

## 2023-07-10 DIAGNOSIS — I10 Essential (primary) hypertension: Secondary | ICD-10-CM | POA: Diagnosis not present

## 2023-07-10 DIAGNOSIS — Z6820 Body mass index (BMI) 20.0-20.9, adult: Secondary | ICD-10-CM | POA: Diagnosis not present

## 2023-07-23 DIAGNOSIS — S335XXA Sprain of ligaments of lumbar spine, initial encounter: Secondary | ICD-10-CM | POA: Diagnosis not present

## 2023-07-23 DIAGNOSIS — M9904 Segmental and somatic dysfunction of sacral region: Secondary | ICD-10-CM | POA: Diagnosis not present

## 2023-07-23 DIAGNOSIS — M9901 Segmental and somatic dysfunction of cervical region: Secondary | ICD-10-CM | POA: Diagnosis not present

## 2023-07-23 DIAGNOSIS — M9903 Segmental and somatic dysfunction of lumbar region: Secondary | ICD-10-CM | POA: Diagnosis not present

## 2023-09-01 DIAGNOSIS — L259 Unspecified contact dermatitis, unspecified cause: Secondary | ICD-10-CM | POA: Diagnosis not present

## 2023-09-01 DIAGNOSIS — Z681 Body mass index (BMI) 19 or less, adult: Secondary | ICD-10-CM | POA: Diagnosis not present

## 2023-09-02 DIAGNOSIS — I781 Nevus, non-neoplastic: Secondary | ICD-10-CM | POA: Diagnosis not present

## 2023-09-02 DIAGNOSIS — L821 Other seborrheic keratosis: Secondary | ICD-10-CM | POA: Diagnosis not present

## 2023-09-02 DIAGNOSIS — L309 Dermatitis, unspecified: Secondary | ICD-10-CM | POA: Diagnosis not present

## 2023-09-04 NOTE — Progress Notes (Signed)
 Surgery orders requested via Epic inbox.

## 2023-09-08 DIAGNOSIS — M1612 Unilateral primary osteoarthritis, left hip: Secondary | ICD-10-CM | POA: Diagnosis not present

## 2023-09-09 NOTE — Progress Notes (Addendum)
 COVID Vaccine Completed:  Date of COVID positive in last 90 days:  PCP - Vallarie Gauze, MD Cardiologist - n/a  Medical/cardiac clearance by Vallarie Gauze in media tab dated 07/25/23  Chest x-ray - n/a EKG - 09/10/23 Epic/chart Stress Test - 03/12/22 [facility] ECHO - n/a Cardiac Cath - n/a Pacemaker/ICD device last checked:n/a Spinal Cord Stimulator:n/a  Bowel Prep - no  Sleep Study - n/a CPAP -   Fasting Blood Sugar - n/a Checks Blood Sugar _____ times a day  Last dose of GLP1 agonist-  N/A GLP1 instructions:  Hold 7 days before surgery    Last dose of SGLT-2 inhibitors-  N/A SGLT-2 instructions:  Hold 3 days before surgery    Blood Thinner Instructions:  Last dose: n/a  Time: Aspirin  Instructions: Last Dose:  Activity level: Can go up a flight of stairs and perform activities of daily living without stopping and without symptoms of chest pain or shortness of breath.   Anesthesia review: PACs on EKG, HTN, asthma  Patient denies shortness of breath, fever, cough and chest pain at PAT appointment  Patient verbalized understanding of instructions that were given to them at the PAT appointment. Patient was also instructed that they will need to review over the PAT instructions again at home before surgery.

## 2023-09-10 ENCOUNTER — Encounter (HOSPITAL_COMMUNITY)
Admission: RE | Admit: 2023-09-10 | Discharge: 2023-09-10 | Disposition: A | Discharge status: Home / Self Care | Source: Ambulatory Visit | Attending: Orthopedic Surgery | Admitting: Orthopedic Surgery

## 2023-09-10 ENCOUNTER — Other Ambulatory Visit: Payer: Self-pay

## 2023-09-10 ENCOUNTER — Encounter (HOSPITAL_COMMUNITY): Payer: Self-pay

## 2023-09-10 VITALS — BP 179/92 | HR 94 | Temp 98.7°F | Resp 16 | Ht 64.0 in | Wt 120.0 lb

## 2023-09-10 DIAGNOSIS — I10 Essential (primary) hypertension: Secondary | ICD-10-CM | POA: Insufficient documentation

## 2023-09-10 DIAGNOSIS — Z01818 Encounter for other preprocedural examination: Secondary | ICD-10-CM | POA: Insufficient documentation

## 2023-09-10 HISTORY — DX: Essential (primary) hypertension: I10

## 2023-09-10 HISTORY — DX: Chronic kidney disease, unspecified: N18.9

## 2023-09-10 LAB — BASIC METABOLIC PANEL WITH GFR
Anion gap: 7 (ref 5–15)
BUN: 47 mg/dL — ABNORMAL HIGH (ref 8–23)
CO2: 26 mmol/L (ref 22–32)
Calcium: 9.3 mg/dL (ref 8.9–10.3)
Chloride: 99 mmol/L (ref 98–111)
Creatinine, Ser: 1.21 mg/dL — ABNORMAL HIGH (ref 0.44–1.00)
GFR, Estimated: 46 mL/min — ABNORMAL LOW (ref >60)
Glucose, Bld: 105 mg/dL — ABNORMAL HIGH (ref 70–99)
Potassium: 4.3 mmol/L (ref 3.5–5.1)
Sodium: 132 mmol/L — ABNORMAL LOW (ref 135–145)

## 2023-09-10 LAB — CBC
HCT: 33.5 % — ABNORMAL LOW (ref 36.0–46.0)
Hemoglobin: 10.6 g/dL — ABNORMAL LOW (ref 12.0–15.0)
MCH: 28.9 pg (ref 26.0–34.0)
MCHC: 31.6 g/dL (ref 30.0–36.0)
MCV: 91.3 fL (ref 80.0–100.0)
Platelets: 289 10*3/uL (ref 150–400)
RBC: 3.67 MIL/uL — ABNORMAL LOW (ref 3.87–5.11)
RDW: 13.8 % (ref 11.5–15.5)
WBC: 6.4 10*3/uL (ref 4.0–10.5)
nRBC: 0 % (ref 0.0–0.2)

## 2023-09-10 LAB — SURGICAL PCR SCREEN
MRSA, PCR: NEGATIVE
Staphylococcus aureus: POSITIVE — AB

## 2023-09-10 LAB — TYPE AND SCREEN
ABO/RH(D): A POS
Antibody Screen: NEGATIVE

## 2023-09-10 NOTE — Patient Instructions (Signed)
 SURGICAL WAITING ROOM VISITATION  Patients having surgery or a procedure may have no more than 2 support people in the waiting area - these visitors may rotate.    Children under the age of 52 must have an adult with them who is not the patient.  Due to an increase in RSV and influenza rates and associated hospitalizations, children ages 23 and under may not visit patients in [facility] hospitals.  Visitors with respiratory illnesses are discouraged from visiting and should remain at home.  If the patient needs to stay at the hospital during part of their recovery, the visitor guidelines for inpatient rooms apply. Pre-op nurse will coordinate an appropriate time for 1 support person to accompany patient in pre-op.  This support person may not rotate.    Please refer to the [facility] website for the visitor guidelines for Inpatients (after your surgery is over and you are in a regular room).    Your procedure is scheduled on: 09/23/23   Report to [facility] Main Entrance    Report to admitting at 9:45AM   Call this number if you have problems the morning of surgery [phone]   Do not eat food :After Midnight.   After Midnight you may have the following liquids until 9:15 AM DAY OF SURGERY  Water Non-Citrus Juices (without pulp, NO RED-Apple, White grape, White cranberry) Black Coffee (NO MILK/CREAM OR CREAMERS, sugar ok)  Clear Tea (NO MILK/CREAM OR CREAMERS, sugar ok) regular and decaf                             Plain Jell-O (NO RED)                                           Fruit ices (not with fruit pulp, NO RED)                                     Popsicles (NO RED)                                                               Sports drinks like Gatorade (NO RED)                  The day of surgery:  Drink ONE (1) Pre-Surgery Clear Ensure at 9:15 AM the morning of surgery. Drink in one sitting. Do not sip.  This drink was given to you during your hospital   pre-op appointment visit. Nothing else to drink after completing the  Pre-Surgery Clear Ensure.          If you have questions, please contact your surgeon's office.   FOLLOW BOWEL PREP AND ANY ADDITIONAL PRE OP INSTRUCTIONS YOU RECEIVED FROM YOUR SURGEON'S OFFICE!!!     Oral Hygiene is also important to reduce your risk of infection.                                    Remember -  BRUSH YOUR TEETH THE MORNING OF SURGERY WITH YOUR REGULAR TOOTHPASTE  DENTURES WILL BE REMOVED PRIOR TO SURGERY PLEASE DO NOT APPLY "Poly grip" OR ADHESIVES!!!    Stop all vitamins and herbal supplements 7 days before surgery.   Take these medicines the morning of surgery with A SIP OF WATER: Tylenol, Inhalers                               You may not have any metal on your body including hair pins, jewelry, and body piercing             Do not wear make-up, lotions, powders, perfumes, or deodorant  Do not wear nail polish including gel and S&S, artificial/acrylic nails, or any other type of covering on natural nails including finger and toenails. If you have artificial nails, gel coating, etc. that needs to be removed by a nail salon please have this removed prior to surgery or surgery may need to be canceled/ delayed if the surgeon/ anesthesia feels like they are unable to be safely monitored.   Do not shave  48 hours prior to surgery.    Do not bring valuables to the hospital. [facility] IS NOT             RESPONSIBLE   FOR VALUABLES.   Contacts, glasses, dentures or bridgework may not be worn into surgery.   Bring small overnight bag day of surgery.   DO NOT BRING YOUR HOME MEDICATIONS TO THE HOSPITAL. PHARMACY WILL DISPENSE MEDICATIONS LISTED ON YOUR MEDICATION LIST TO YOU DURING YOUR ADMISSION IN THE HOSPITAL!    Patients discharged on the day of surgery will not be allowed to drive home.  Someone NEEDS to stay with you for the first 24 hours after anesthesia.              Please read over  the following fact sheets you were given: IF YOU HAVE QUESTIONS ABOUT YOUR PRE-OP INSTRUCTIONS PLEASE CALL [phone] Contras   If you received a COVID test during your pre-op visit  it is requested that you wear a mask when out in public, stay away from anyone that may not be feeling well and notify your surgeon if you develop symptoms. If you test positive for Covid or have been in contact with anyone that has tested positive in the last 10 days please notify you surgeon.      Pre-operative 5 CHG Bath Instructions   You can play a key role in reducing the risk of infection after surgery. Your skin needs to be as free of germs as possible. You can reduce the number of germs on your skin by washing with CHG (chlorhexidine gluconate) soap before surgery. CHG is an antiseptic soap that kills germs and continues to kill germs even after washing.   DO NOT use if you have an allergy to chlorhexidine/CHG or antibacterial soaps. If your skin becomes reddened or irritated, stop using the CHG and notify one of our RNs at [phone].   Please shower with the CHG soap starting 4 days before surgery using the following schedule:     Please keep in mind the following:  DO NOT shave, including legs and underarms, starting the day of your first shower.   You may shave your face at any point before/day of surgery.  Place clean sheets on your bed the day you start using CHG soap. Use a clean washcloth (  not used since being washed) for each shower. DO NOT sleep with pets once you start using the CHG.   CHG Shower Instructions:  If you choose to wash your hair and private area, wash first with your normal shampoo/soap.  After you use shampoo/soap, rinse your hair and body thoroughly to remove shampoo/soap residue.  Turn the water OFF and apply about 3 tablespoons (45 ml) of CHG soap to a CLEAN washcloth.  Apply CHG soap ONLY FROM YOUR NECK DOWN TO YOUR TOES (washing for 3-5 minutes)  DO NOT use CHG  soap on face, private areas, open wounds, or sores.  Pay special attention to the area where your surgery is being performed.  If you are having back surgery, having someone wash your back for you may be helpful. Wait 2 minutes after CHG soap is applied, then you may rinse off the CHG soap.  Pat dry with a clean towel  Put on clean clothes/pajamas   If you choose to wear lotion, please use ONLY the CHG-compatible lotions on the back of this paper.     Additional instructions for the day of surgery: DO NOT APPLY any lotions, deodorants, cologne, or perfumes.   Put on clean/comfortable clothes.  Brush your teeth.  Ask your nurse before applying any prescription medications to the skin.      CHG Compatible Lotions   Aveeno Moisturizing lotion  Cetaphil Moisturizing Cream  Cetaphil Moisturizing Lotion  Clairol Herbal Essence Moisturizing Lotion, Dry Skin  Clairol Herbal Essence Moisturizing Lotion, Extra Dry Skin  Clairol Herbal Essence Moisturizing Lotion, Normal Skin  Curel Age Defying Therapeutic Moisturizing Lotion with Alpha Hydroxy  Curel Extreme Care Body Lotion  Curel Soothing Hands Moisturizing Hand Lotion  Curel Therapeutic Moisturizing Cream, Fragrance-Free  Curel Therapeutic Moisturizing Lotion, Fragrance-Free  Curel Therapeutic Moisturizing Lotion, Original Formula  Eucerin Daily Replenishing Lotion  Eucerin Dry Skin Therapy Plus Alpha Hydroxy Crme  Eucerin Dry Skin Therapy Plus Alpha Hydroxy Lotion  Eucerin Original Crme  Eucerin Original Lotion  Eucerin Plus Crme Eucerin Plus Lotion  Eucerin TriLipid Replenishing Lotion  Keri Anti-Bacterial Hand Lotion  Keri Deep Conditioning Original Lotion Dry Skin Formula Softly Scented  Keri Deep Conditioning Original Lotion, Fragrance Free Sensitive Skin Formula  Keri Lotion Fast Absorbing Fragrance Free Sensitive Skin Formula  Keri Lotion Fast Absorbing Softly Scented Dry Skin Formula  Keri Original Lotion  Keri Skin  Renewal Lotion Keri Silky Smooth Lotion  Keri Silky Smooth Sensitive Skin Lotion  Nivea Body Creamy Conditioning Oil  Nivea Body Extra Enriched Lotion  Nivea Body Original Lotion  Nivea Body Sheer Moisturizing Lotion Nivea Crme  Nivea Skin Firming Lotion  NutraDerm 30 Skin Lotion  NutraDerm Skin Lotion  NutraDerm Therapeutic Skin Cream  NutraDerm Therapeutic Skin Lotion  ProShield Protective Hand Cream  Provon moisturizing lotion   Incentive Spirometer  An incentive spirometer is a tool that can help keep your lungs clear and active. This tool measures how well you are filling your lungs with each breath. Taking long deep breaths may help reverse or decrease the chance of developing breathing (pulmonary) problems (especially infection) following: A long period of time when you are unable to move or be active. BEFORE THE PROCEDURE  If the spirometer includes an indicator to show your best effort, your nurse or respiratory therapist will set it to a desired goal. If possible, sit up straight or lean slightly forward. Try not to slouch. Hold the incentive spirometer in an upright position. INSTRUCTIONS FOR USE  Sit on the edge of your bed if possible, or sit up as far as you can in bed or on a chair. Hold the incentive spirometer in an upright position. Breathe out normally. Place the mouthpiece in your mouth and seal your lips tightly around it. Breathe in slowly and as deeply as possible, raising the piston or the ball toward the top of the column. Hold your breath for 3-5 seconds or for as long as possible. Allow the piston or ball to fall to the bottom of the column. Remove the mouthpiece from your mouth and breathe out normally. Rest for a few seconds and repeat Steps 1 through 7 at least 10 times every 1-2 hours when you are awake. Take your time and take a few normal breaths between deep breaths. The spirometer may include an indicator to show your best effort. Use the indicator  as a goal to work toward during each repetition. After each set of 10 deep breaths, practice coughing to be sure your lungs are clear. If you have an incision (the cut made at the time of surgery), support your incision when coughing by placing a pillow or rolled up towels firmly against it. Once you are able to get out of bed, walk around indoors and cough well. You may stop using the incentive spirometer when instructed by your caregiver.  RISKS AND COMPLICATIONS Take your time so you do not get dizzy or light-headed. If you are in pain, you may need to take or ask for pain medication before doing incentive spirometry. It is harder to take a deep breath if you are having pain. AFTER USE Rest and breathe slowly and easily. It can be helpful to keep track of a log of your progress. Your caregiver can provide you with a simple table to help with this. If you are using the spirometer at home, follow these instructions: SEEK MEDICAL CARE IF:  You are having difficultly using the spirometer. You have trouble using the spirometer as often as instructed. Your pain medication is not giving enough relief while using the spirometer. You develop fever of 100.5 F (38.1 C) or higher. SEEK IMMEDIATE MEDICAL CARE IF:  You cough up bloody sputum that had not been present before. You develop fever of 102 F (38.9 C) or greater. You develop worsening pain at or near the incision site. MAKE SURE YOU:  Understand these instructions. Will watch your condition. Will get help right away if you are not doing well or get worse. Document Released: 09/23/2006 Document Revised: 08/05/2011 Document Reviewed: 11/24/2006 ExitCare Patient Information 2014 Exit[employer], [place].   ________________________________________________________________________ WHAT IS A BLOOD TRANSFUSION? Blood Transfusion Information  A transfusion is the replacement of blood or some of its parts. Blood is made up of multiple cells which  provide different functions. Red blood cells carry oxygen and are used for blood loss replacement. White blood cells fight against infection. Platelets control bleeding. Plasma helps clot blood. Other blood products are available for specialized needs, such as hemophilia or other clotting disorders. BEFORE THE TRANSFUSION  Who gives blood for transfusions?  Healthy volunteers who are fully evaluated to make sure their blood is safe. This is blood bank blood. Transfusion therapy is the safest it has ever been in the practice of medicine. Before blood is taken from a donor, a complete history is taken to make sure that person has no history of diseases nor engages in risky social behavior (examples are intravenous drug use or sexual activity with  multiple partners). The donor's travel history is screened to minimize risk of transmitting infections, such as malaria. The donated blood is tested for signs of infectious diseases, such as HIV and hepatitis. The blood is then tested to be sure it is compatible with you in order to minimize the chance of a transfusion reaction. If you or a relative donates blood, this is often done in anticipation of surgery and is not appropriate for emergency situations. It takes many days to process the donated blood. RISKS AND COMPLICATIONS Although transfusion therapy is very safe and saves many lives, the main dangers of transfusion include:  Getting an infectious disease. Developing a transfusion reaction. This is an allergic reaction to something in the blood you were given. Every precaution is taken to prevent this. The decision to have a blood transfusion has been considered carefully by your caregiver before blood is given. Blood is not given unless the benefits outweigh the risks. AFTER THE TRANSFUSION Right after receiving a blood transfusion, you will usually feel much better and more energetic. This is especially true if your red blood cells have gotten low  (anemic). The transfusion raises the level of the red blood cells which carry oxygen, and this usually causes an energy increase. The nurse administering the transfusion will monitor you carefully for complications. HOME CARE INSTRUCTIONS  No special instructions are needed after a transfusion. You may find your energy is better. Speak with your caregiver about any limitations on activity for underlying diseases you may have. SEEK MEDICAL CARE IF:  Your condition is not improving after your transfusion. You develop redness or irritation at the intravenous (IV) site. SEEK IMMEDIATE MEDICAL CARE IF:  Any of the following symptoms occur over the next 12 hours: Shaking chills. You have a temperature by mouth above 102 F (38.9 C), not controlled by medicine. Chest, back, or muscle pain. People around you feel you are not acting correctly or are confused. Shortness of breath or difficulty breathing. Dizziness and fainting. You get a rash or develop hives. You have a decrease in urine output. Your urine turns a dark color or changes to pink, red, or brown. Any of the following symptoms occur over the next 10 days: You have a temperature by mouth above 102 F (38.9 C), not controlled by medicine. Shortness of breath. Weakness after normal activity. The white part of the eye turns yellow (jaundice). You have a decrease in the amount of urine or are urinating less often. Your urine turns a dark color or changes to pink, red, or brown. Document Released: 05/10/2000 Document Revised: 08/05/2011 Document Reviewed: 12/28/2007 [facility] Patient Information 2014 [place], [place].  _______________________________________________________________________

## 2023-09-11 NOTE — Progress Notes (Signed)
ST[facility]+ results routed to Dr. Landau  

## 2023-09-16 NOTE — Care Plan (Signed)
 Ortho Bundle Case Management Note  Patient Details  Name: Tammy Sutton MRN: [id number] Date of Birth: [birth year] met with patient in the office for H&P. will discharge to home with friends to help. has RW at home. OPPT set up with [facility]. discharge instructions discussed and questions answered. Patient and MD in agreement with plan. Choice offered.                    DME Arranged:    DME Agency:     HH Arranged:    HH Agency:     Additional Comments: Please contact me with any questions of if this plan should need to change.  Cornelia Dieter,  RN,BSN,MHA,CCM  [facility] Orthopaedic Specialist  [phone] 09/16/2023, 4:55 PM

## 2023-09-22 NOTE — H&P (Signed)
 HIP ARTHROPLASTY ADMISSION H&P  Patient ID: Tammy Sutton MRN: [id number] DOB/AGE: 1946/02/15 78 y.o.  Chief Complaint: left hip pain.  Planned Procedure Date: 09/23/23 Medical Clearance by Dr. Herman Longs   HPI: Tammy Sutton is a 78 y.o. female who presents for evaluation of LEFT HIP OA. The patient has a history of pain and functional disability in the left hip due to arthritis and has failed non-surgical conservative treatments for greater than 12 weeks to include NSAID's and/or analgesics, corticosteriod injections, and activity modification.  Onset of symptoms was gradual, starting 2 years ago with gradually worsening course since that time. The patient noted no past surgery on the left hip.  Patient currently rates pain at 5 out of 10 with activity. Patient has worsening of pain with activity and weight bearing, pain that interferes with activities of daily living, and pain with passive range of motion.  Patient has evidence of joint space narrowing by imaging studies.  There is no active infection.  Past Medical History:  Diagnosis Date   Arthritis    Asthma    Chronic kidney disease    borderline   Hypertension    PONV (postoperative nausea and vomiting)    and slow to wake up per pt   Sjogren's syndrome [facility])    Past Surgical History:  Procedure Laterality Date   BREAST BIOPSY Left 2003   CATARACT EXTRACTION W/ INTRAOCULAR LENS IMPLANT Bilateral 2019   COLONOSCOPY     Dental implant     REVERSE SHOULDER ARTHROPLASTY Right 03/26/2022   Procedure: REVERSE SHOULDER ARTHROPLASTY;  Surgeon: Osa Blase, MD;  Location: [facility] ORS;  Service: Orthopedics;  Laterality: Right;   TOTAL KNEE ARTHROPLASTY Right 06/26/2021   Procedure: TOTAL KNEE ARTHROPLASTY;  Surgeon: Osa Blase, MD;  Location: [facility] ORS;  Service: Orthopedics;  Laterality: Right;   TUBAL LIGATION  1983   Allergies  Allergen Reactions   Clindamycin/Lincomycin Itching   Sulfamethoxazole-Trimethoprim Itching     Bactrim    Prior to Admission medications   Medication Sig Start Date End Date Taking? Authorizing Provider  acetaminophen  (TYLENOL ) 500 MG tablet Take 500-1,000 mg by mouth every 6 (six) hours as needed (pain.).   Yes [provider]  albuterol  (VENTOLIN  HFA) 108 (90 Base) MCG/ACT inhaler Inhale 2 puffs into the lungs every 6 (six) hours as needed for wheezing or shortness of breath.   Yes [provider]  Collagen-Vitamin C-Biotin (COLLAGEN PO) Take 30 mLs by mouth daily. Amare HL5 (grassfed collagen, hydrolysed)   Yes [provider]  losartan (COZAAR) 25 MG tablet Take 25 mg by mouth daily. 09/01/23  Yes [provider]  OVER THE COUNTER MEDICATION Take 30 mLs by mouth daily. Tri[employer] Health Pack (Vitamins and Superfood)   Yes [provider]  RESTASIS  0.05 % ophthalmic emulsion Place 1 drop into both eyes in the morning. 02/02/21  Yes [provider]   Social History   Socioeconomic History   Marital status: Divorced    Spouse name: Not on file   Number of children: Not on file   Years of education: Not on file   Highest education level: Not on file  Occupational History   Not on file  Tobacco Use   Smoking status: Never   Smokeless tobacco: Never  Vaping Use   Vaping status: Never Used  Substance and Sexual Activity   Alcohol  use: Not Currently    Comment: Rare   Drug use: Never   Sexual activity: Not on  file  Other Topics Concern   Not on file  Social History Narrative   Not on file   Social Drivers of Health   Financial Resource Strain: Not on file  Food Insecurity: Not on file  Transportation Needs: Not on file  Physical Activity: Not on file  Stress: Not on file  Social Connections: Not on file   No family history on file.  ROS: Currently denies lightheadedness, dizziness, Fever, chills, CP, SOB.   No personal history of DVT, PE, MI, or CVA. No loose teeth or dentures All other systems have been reviewed  and were otherwise currently negative with the exception of those mentioned in the HPI and as above.  Objective: Vitals: Height: 5?4?Aaron Aas  Weight: 120.3 pounds.  Blood pressure: 160/76.  O2 SAT: 97% on room air.  Temperature: 98.5.  Pulse: 68. Physical Exam: General: Alert, NAD. Trendelenberg Gait  HEENT: EOMI, Good Neck Extension  Pulm: No increased work of breathing.  Clear B/L A/P w/o crackle or wheeze.  CV: RRR, No m/g/r appreciated  GI: soft, NT, ND Neuro: Neuro without gross focal deficit.  Sensation intact distally Skin: No lesions in the area of chief complaint MSK/Surgical Site: left Hip pain with passive ROM.  Positive Stinchfield.  5/5 strength.  NVI.    Imaging Review Plain radiographs demonstrate severe degenerative joint disease of the left hip.   Preoperative templating of the joint replacement has been completed, documented, and submitted to the Operating Room personnel in order to optimize intra-operative equipment management.  Assessment: LEFT HIP OA    Plan: Plan for Procedure(s): ARTHROPLASTY, HIP, TOTAL,POSTERIOR APPROACH  The patient history, physical exam, clinical judgement of the provider and imaging are consistent with end stage degenerative joint disease and total joint arthroplasty is deemed medically necessary. The treatment options including medical management, injection therapy, and arthroplasty were discussed at length. The risks and benefits of Procedure(s): ARTHROPLASTY, HIP, TOTAL,POSTERIOR APPROACH were presented and reviewed.  The risks of nonoperative treatment, versus surgical intervention including but not limited to continued pain, aseptic loosening, stiffness, dislocation/subluxation, infection, bleeding, nerve injury, blood clots, cardiopulmonary complications, morbidity, mortality, among others were discussed. The patient verbalizes understanding and wishes to proceed with the plan.  Patient is being admitted for surgery, pain control, PT,  prophylactic antibiotics, VTE prophylaxis, progressive ambulation, ADL's and discharge planning.    The patient does meet the criteria for TXA which will be used perioperatively.     Marvelene Stoneberg K Avnoor Koury, PA-C 09/22/2023 10:16 AM

## 2023-09-22 NOTE — Discharge Instructions (Signed)

## 2023-09-23 ENCOUNTER — Ambulatory Visit (HOSPITAL_COMMUNITY)
Admission: RE | Admit: 2023-09-23 | Discharge: 2023-09-23 | Disposition: A | Discharge status: Home / Self Care | Payer: PPO | Source: Ambulatory Visit | Attending: Orthopedic Surgery | Admitting: Orthopedic Surgery

## 2023-09-23 ENCOUNTER — Encounter (HOSPITAL_COMMUNITY)
Admission: RE | Disposition: A | Discharge status: Home / Self Care | Payer: Self-pay | Source: Ambulatory Visit | Attending: Orthopedic Surgery

## 2023-09-23 ENCOUNTER — Ambulatory Visit (HOSPITAL_COMMUNITY): Admitting: Certified Registered"

## 2023-09-23 ENCOUNTER — Ambulatory Visit (HOSPITAL_COMMUNITY)

## 2023-09-23 ENCOUNTER — Encounter (HOSPITAL_COMMUNITY): Payer: Self-pay | Admitting: Orthopedic Surgery

## 2023-09-23 ENCOUNTER — Ambulatory Visit (HOSPITAL_COMMUNITY): Payer: Self-pay | Admitting: Physician Assistant

## 2023-09-23 DIAGNOSIS — M1990 Unspecified osteoarthritis, unspecified site: Secondary | ICD-10-CM | POA: Insufficient documentation

## 2023-09-23 DIAGNOSIS — M1612 Unilateral primary osteoarthritis, left hip: Secondary | ICD-10-CM

## 2023-09-23 DIAGNOSIS — I10 Essential (primary) hypertension: Secondary | ICD-10-CM | POA: Diagnosis not present

## 2023-09-23 DIAGNOSIS — M1632 Unilateral osteoarthritis resulting from hip dysplasia, left hip: Secondary | ICD-10-CM | POA: Diagnosis not present

## 2023-09-23 DIAGNOSIS — R609 Edema, unspecified: Secondary | ICD-10-CM | POA: Diagnosis not present

## 2023-09-23 DIAGNOSIS — Z79899 Other long term (current) drug therapy: Secondary | ICD-10-CM | POA: Insufficient documentation

## 2023-09-23 DIAGNOSIS — J45909 Unspecified asthma, uncomplicated: Secondary | ICD-10-CM | POA: Diagnosis not present

## 2023-09-23 DIAGNOSIS — Z471 Aftercare following joint replacement surgery: Secondary | ICD-10-CM | POA: Diagnosis not present

## 2023-09-23 DIAGNOSIS — Z96642 Presence of left artificial hip joint: Secondary | ICD-10-CM | POA: Diagnosis not present

## 2023-09-23 HISTORY — PX: TOTAL HIP ARTHROPLASTY: SHX124

## 2023-09-23 LAB — ABO/RH: ABO/RH(D): A POS

## 2023-09-23 SURGERY — ARTHROPLASTY, HIP, TOTAL,POSTERIOR APPROACH
Anesthesia: Spinal | Site: Hip | Laterality: Left

## 2023-09-23 MED ORDER — LACTATED RINGERS IV BOLUS
250.0000 mL | Freq: Once | INTRAVENOUS | Status: AC
  Administered 2023-09-23: 250 mL via INTRAVENOUS

## 2023-09-23 MED ORDER — POVIDONE-IODINE 7.5 % EX SOLN: Freq: Once | CUTANEOUS | Status: DC

## 2023-09-23 MED ORDER — DEXAMETHASONE SODIUM PHOSPHATE 10 MG/ML IJ SOLN
INTRAMUSCULAR | Status: DC | PRN
Start: 2023-09-23 — End: 2023-09-23
  Administered 2023-09-23: 8 mg via INTRAVENOUS

## 2023-09-23 MED ORDER — CEFAZOLIN SODIUM-DEXTROSE 2-4 GM/100ML-% IV SOLN
2.0000 g | INTRAVENOUS | Status: AC
  Administered 2023-09-23: 2 g via INTRAVENOUS
  Filled 2023-09-23: qty 100

## 2023-09-23 MED ORDER — PHENYLEPHRINE HCL-NACL 20-0.9 MG/250ML-% IV SOLN
INTRAVENOUS | Status: DC | PRN
  Administered 2023-09-23: 25 ug/min via INTRAVENOUS

## 2023-09-23 MED ORDER — LACTATED RINGERS IV BOLUS
500.0000 mL | Freq: Once | INTRAVENOUS | Status: AC
  Administered 2023-09-23: 500 mL via INTRAVENOUS

## 2023-09-23 MED ORDER — KETOROLAC TROMETHAMINE 30 MG/ML IJ SOLN
INTRAMUSCULAR | Status: AC
  Filled 2023-09-23: qty 1

## 2023-09-23 MED ORDER — BUPIVACAINE HCL (PF) 0.25 % IJ SOLN
INTRAMUSCULAR | Status: DC | PRN
Start: 2023-09-23 — End: 2023-09-23
  Administered 2023-09-23: 30 mL

## 2023-09-23 MED ORDER — 0.9 % SODIUM CHLORIDE (POUR BTL) OPTIME
TOPICAL | Status: DC | PRN
  Administered 2023-09-23: 1000 mL

## 2023-09-23 MED ORDER — LACTATED RINGERS IV BOLUS: 250.0000 mL | Freq: Once | INTRAVENOUS | Status: DC

## 2023-09-23 MED ORDER — ONDANSETRON HCL 4 MG/2ML IJ SOLN
INTRAMUSCULAR | Status: DC | PRN
  Administered 2023-09-23: 4 mg via INTRAVENOUS

## 2023-09-23 MED ORDER — KETOROLAC TROMETHAMINE 30 MG/ML IJ SOLN
INTRAMUSCULAR | Status: DC | PRN
  Administered 2023-09-23: 30 mg

## 2023-09-23 MED ORDER — ACETAMINOPHEN 500 MG PO TABS
1000.0000 mg | ORAL_TABLET | Freq: Once | ORAL | Status: AC
  Administered 2023-09-23: 1000 mg via ORAL
  Filled 2023-09-23: qty 2

## 2023-09-23 MED ORDER — POVIDONE-IODINE 10 % EX SWAB: 2.0000 | Freq: Once | CUTANEOUS | Status: DC

## 2023-09-23 MED ORDER — LIDOCAINE 2% (20 MG/ML) 5 ML SYRINGE
INTRAMUSCULAR | Status: DC | PRN
  Administered 2023-09-23: 50 mg via INTRAVENOUS

## 2023-09-23 MED ORDER — CHLORHEXIDINE GLUCONATE 0.12 % MT SOLN
15.0000 mL | Freq: Once | OROMUCOSAL | Status: AC
  Administered 2023-09-23: 15 mL via OROMUCOSAL

## 2023-09-23 MED ORDER — CHLORHEXIDINE GLUCONATE 4 % EX SOLN: 1.0000 | CUTANEOUS | 1 refills | Status: AC

## 2023-09-23 MED ORDER — STERILE WATER FOR IRRIGATION IR SOLN
Status: DC | PRN
  Administered 2023-09-23: 1000 mL

## 2023-09-23 MED ORDER — AMISULPRIDE (ANTIEMETIC) 5 MG/2ML IV SOLN: 10.0000 mg | Freq: Once | INTRAVENOUS | Status: DC | PRN

## 2023-09-23 MED ORDER — LACTATED RINGERS IV SOLN
INTRAVENOUS | Status: DC
Start: 2023-09-23 — End: 2023-09-23

## 2023-09-23 MED ORDER — CEFAZOLIN SODIUM-DEXTROSE 2-4 GM/100ML-% IV SOLN: 2.0000 g | Freq: Four times a day (QID) | INTRAVENOUS | Status: DC

## 2023-09-23 MED ORDER — BUPIVACAINE IN DEXTROSE 0.75-8.25 % IT SOLN
INTRATHECAL | Status: DC | PRN
  Administered 2023-09-23: 2 mL via INTRATHECAL

## 2023-09-23 MED ORDER — MUPIROCIN 2 % EX OINT: 1.0000 | TOPICAL_OINTMENT | Freq: Two times a day (BID) | CUTANEOUS | 0 refills | Status: AC

## 2023-09-23 MED ORDER — TRANEXAMIC ACID-NACL 1000-0.7 MG/100ML-% IV SOLN
1000.0000 mg | INTRAVENOUS | Status: AC
  Administered 2023-09-23: 1000 mg via INTRAVENOUS
  Filled 2023-09-23: qty 100

## 2023-09-23 MED ORDER — FENTANYL CITRATE PF 50 MCG/ML IJ SOSY: 25.0000 ug | PREFILLED_SYRINGE | INTRAMUSCULAR | Status: DC | PRN

## 2023-09-23 MED ORDER — ONDANSETRON HCL 4 MG/2ML IJ SOLN: 4.0000 mg | Freq: Once | INTRAMUSCULAR | Status: DC | PRN

## 2023-09-23 MED ORDER — PROPOFOL 10 MG/ML IV BOLUS
INTRAVENOUS | Status: DC | PRN
  Administered 2023-09-23 (×2): 20 mg via INTRAVENOUS
  Administered 2023-09-23: 75 ug/kg/min via INTRAVENOUS

## 2023-09-23 MED ORDER — ORAL CARE MOUTH RINSE: 15.0000 mL | Freq: Once | OROMUCOSAL | Status: AC

## 2023-09-23 MED ORDER — TRANEXAMIC ACID-NACL 1000-0.7 MG/100ML-% IV SOLN: 1000.0000 mg | Freq: Once | INTRAVENOUS | Status: DC

## 2023-09-23 MED ORDER — BUPIVACAINE HCL (PF) 0.25 % IJ SOLN
INTRAMUSCULAR | Status: AC
Start: 2023-09-23 — End: ?
  Filled 2023-09-23: qty 30

## 2023-09-23 SURGICAL SUPPLY — 56 items
BAG COUNTER SPONGE SURGICOUNT (BAG) IMPLANT
BALL HIP ARTICU EZE 36 8.5 (Hips) IMPLANT
BIT DRILL 2.0X128 (BIT) ×1 IMPLANT
BLADE SAW SGTL 73X25 THK (BLADE) ×1 IMPLANT
CLSR STERI-STRIP ANTIMIC 1/2X4 (GAUZE/BANDAGES/DRESSINGS) ×2 IMPLANT
COVER SURGICAL LIGHT HANDLE (MISCELLANEOUS) ×1 IMPLANT
DRAPE INCISE IOBAN 66X45 STRL (DRAPES) ×1 IMPLANT
DRAPE POUCH INSTRU U-SHP 10X18 (DRAPES) ×1 IMPLANT
DRAPE SHEET LG 3/4 BI-LAMINATE (DRAPES) ×1 IMPLANT
DRAPE SURG 17X11 SM STRL (DRAPES) ×1 IMPLANT
DRAPE SURG ORHT 6 SPLT 77X108 (DRAPES) ×2 IMPLANT
DRAPE U-SHAPE 47X51 STRL (DRAPES) ×1 IMPLANT
DRSG MEPILEX POST OP 4X12 (GAUZE/BANDAGES/DRESSINGS) ×1 IMPLANT
DURAPREP 26ML APPLICATOR (WOUND CARE) ×2 IMPLANT
ELECT BLADE TIP CTD 4 INCH (ELECTRODE) ×1 IMPLANT
ELECT PENCIL ROCKER SW 15FT (MISCELLANEOUS) ×1 IMPLANT
ELECT REM PT RETURN 15FT ADLT (MISCELLANEOUS) ×1 IMPLANT
ELIMINATOR HOLE APEX DEPUY (Hips) IMPLANT
FACESHIELD WRAPAROUND (MASK) ×1 IMPLANT
FACESHIELD WRAPAROUND OR TEAM (MASK) ×1 IMPLANT
GLOVE BIO SURGEON STRL SZ 6.5 (GLOVE) ×1 IMPLANT
GLOVE BIO SURGEON STRL SZ7.5 (GLOVE) ×1 IMPLANT
GLOVE BIOGEL PI IND STRL 7.0 (GLOVE) ×1 IMPLANT
GLOVE BIOGEL PI IND STRL 8 (GLOVE) ×1 IMPLANT
GOWN STRL SURGICAL XL XLNG (GOWN DISPOSABLE) ×2 IMPLANT
HEAD ARTICULEZE (Hips) IMPLANT
HOOD PEEL AWAY T7 (MISCELLANEOUS) ×3 IMPLANT
KIT BASIN OR (CUSTOM PROCEDURE TRAY) ×1 IMPLANT
KIT TURNOVER KIT A (KITS) IMPLANT
LINER NEUTRAL 52X36MM PLUS 4 (Liner) IMPLANT
MANIFOLD NEPTUNE II (INSTRUMENTS) ×1 IMPLANT
NDL SAFETY ECLIPSE 18X1.5 (NEEDLE) ×2 IMPLANT
NEEDLE ANCHOR KEITH 2 7/8 STR (NEEDLE) ×1 IMPLANT
NS IRRIG 1000ML POUR BTL (IV SOLUTION) ×1 IMPLANT
PACK TOTAL JOINT (CUSTOM PROCEDURE TRAY) ×1 IMPLANT
PIN SECTOR W/GRIP ACE CUP 52MM (Hips) IMPLANT
PROTECTOR NERVE ULNAR (MISCELLANEOUS) ×1 IMPLANT
SCREW 6.5MMX30MM (Screw) IMPLANT
STEM FEM ACTIS STD SZ4 (Stem) IMPLANT
SUCTION TUBE FRAZIER 12FR DISP (SUCTIONS) ×1 IMPLANT
SUT ETHIBOND NAB CT1 #1 30IN (SUTURE) ×3 IMPLANT
SUT STRATAFIX 14 PDO 48 VLT (SUTURE) ×1 IMPLANT
SUT STRATAFIX PDO 1 14 VIOLET (SUTURE) ×1 IMPLANT
SUT VIC AB 0 CT1 36 (SUTURE) ×1 IMPLANT
SUT VIC AB 1 CT1 36 (SUTURE) ×2 IMPLANT
SUT VIC AB 2-0 CT1 TAPERPNT 27 (SUTURE) ×2 IMPLANT
SUT VIC AB 3-0 SH 27X BRD (SUTURE) ×1 IMPLANT
SYR 30ML LL (SYRINGE) IMPLANT
SYR 3ML LL SCALE MARK (SYRINGE) IMPLANT
SYR CONTROL 10ML LL (SYRINGE) ×2 IMPLANT
TOWEL GREEN STERILE FF (TOWEL DISPOSABLE) IMPLANT
TOWEL OR 17X26 10 PK STRL BLUE (TOWEL DISPOSABLE) ×1 IMPLANT
TRAY FOLEY MTR SLVR 16FR STAT (SET/KITS/TRAYS/PACK) ×1 IMPLANT
TRAY FOLEY SLVR 14FR TEMP STAT (SET/KITS/TRAYS/PACK) IMPLANT
TUBE SUCTION HIGH CAP CLEAR NV (SUCTIONS) ×1 IMPLANT
WATER STERILE IRR 1000ML POUR (IV SOLUTION) ×2 IMPLANT

## 2023-09-23 NOTE — Anesthesia Preprocedure Evaluation (Addendum)
 Anesthesia Evaluation  Patient identified by MRN, date of birth, ID band Patient awake    Reviewed: Allergy & Precautions, NPO status , Patient's Chart, lab work & pertinent test results  History of Anesthesia Complications (+) PONV and history of anesthetic complications  Airway Mallampati: II  TM Distance: >3 FB Neck ROM: Full    Dental no notable dental hx.    Pulmonary asthma    Pulmonary exam normal        Cardiovascular hypertension, Pt. on medications Normal cardiovascular exam     Neuro/Psych negative neurological ROS  negative psych ROS   GI/Hepatic negative GI ROS, Neg liver ROS,,,  Endo/Other  negative endocrine ROS    Renal/GU Renal disease     Musculoskeletal  (+) Arthritis ,    Abdominal   Peds  Hematology  (+) Blood dyscrasia, anemia PLT: 289   Anesthesia Other Findings LEFT HIP OA  Reproductive/Obstetrics                             Anesthesia Physical Anesthesia Plan  ASA: 2  Anesthesia Plan: Spinal   Post-op Pain Management:    Induction:   PONV Risk Score and Plan: 3 and Ondansetron , Dexamethasone , Propofol  infusion and Treatment may vary due to age or medical condition  Airway Management Planned: Simple Face Mask  Additional Equipment:   Intra-op Plan:   Post-operative Plan:   Informed Consent: I have reviewed the patients History and Physical, chart, labs and discussed the procedure including the risks, benefits and alternatives for the proposed anesthesia with the patient or authorized representative who has indicated his/her understanding and acceptance.     Dental advisory given  Plan Discussed with: CRNA  Anesthesia Plan Comments:        Anesthesia Quick Evaluation

## 2023-09-23 NOTE — Op Note (Addendum)
 09/23/2023  12:05 PM  PATIENT:  Tammy Sutton   MRN: [id number]  PRE-OPERATIVE DIAGNOSIS:  left hip osteoarthritis due to dysplasia   POST-OPERATIVE DIAGNOSIS:  same  PROCEDURE:  Procedure(s): ARTHROPLASTY, HIP, TOTAL,POSTERIOR APPROACH, LEFT  PREOPERATIVE INDICATIONS:    Tammy Sutton is an 78 y.o. female who has a diagnosis of left hip dysplasia that has led to OA and elected for surgical management after failing conservative treatment.  The risks benefits and alternatives were discussed with the patient including but not limited to the risks of nonoperative treatment, versus surgical intervention including infection, bleeding, nerve injury, periprosthetic fracture, the need for revision surgery, dislocation, leg length discrepancy, blood clots, cardiopulmonary complications, morbidity, mortality, among others, and they were willing to proceed.     OPERATIVE REPORT     SURGEON:  Osa Blase, MD  SECOND SURGEON:  Claiborne Crew, MD, scrubbed for assistance with complex anatomy reconstruction and decision making and instrumentation    ASSISTANT:  Hurshel Maidens, PA-C, (Present throughout the entire procedure,  necessary for completion of procedure in a timely manner, assisting with retraction, instrumentation, and closure)     ANESTHESIA:  spinal  ESTIMATED BLOOD LOSS:    COMPLICATIONS:  None.     UNIQUE ASPECTS OF THE CASE:  The acetabulum was extremely dysplastic, shallow, and wide, with superior loss of bone.  We reamed to the medial wall, and had significant anteversion compared to her dysplastic anatomy.  I initially put in a +5 but this still had a lot of shuck, and so I went to a +8.5, and there was even still some shuck, but she felt stable and didn't have any impingement.  Tissue closure was excellent.    COMPONENTS:  Implant Name: PIN SECTOR W/GRIP ACE CUP - [id number] Type: Hips Inv. Item: PIN SECTOR W/GRIP ACE CUP Serial No.:  Manufacturer: [employer]PUY  ORTHOPA[employer] Lot No.: [id number] LRB: Left No. Used: 1 Action: Implanted   Implant NameNanetta Baars APEX DEPUY - [id number] Type: Hips Inv. Item: ELIMINATOR HOLE APEX DEPUY Serial No.:  Manufacturer: [employer]PU[employer] ORTHOPAED[employer] Lot No.: [id number] LRB: Left No. Used: 1 Action: Implanted   Implant Name: SCREW 6.5MMX30MM - [id number] Type: Screw Inv. Item: SCREW 6.5MMX30MM Serial No.:  Manufacturer: DEPUY ORTHOPAED[employer] Lot No.: [id number] LRB: Left No. Used: 1 Action: Implanted   Implant Name: LINER NEUTRAL 52X36MM PLUS 4 - [id number] Type: Liner Inv. Item: LINER NEUTRAL 52X36MM PLUS 4 Serial No.:  Manufacturer: [employer] ORTHOPA[employer] Lot No.: [id number] LRB: Left No. Used: 1 Action: Implanted   Implant Name: STEM FEM ACTIS STD SZ4 - [id number] Type: Stem Inv. Item: STEM FEM ACTIS STD SZ4 Serial No.:  Manufacturer: [employer] ORTHOPA[employer] Lot No.: [id number] LRB: Left No. Used: 1 Action: Implanted   Implant Name: BALL HIP ARTICU EZE 36 8.5 - [id number] Type: Hips Inv. Item: BALL HIP ARTICU EZE 36 8.5 Serial No.:  Manufacturer: [employer] ORTHOPA[employer] Lot No.: [id number] LRB: Left No. Used: 1 Action: Implanted     PROCEDURE IN DETAIL:   The patient was met in the holding area and  identified.  The appropriate hip was identified and marked at the operative site.  The patient was then transported to the OR  and  placed under anesthesia.  At that point, the patient was  placed in the lateral decubitus position with the operative side up and  secured to the operating room table and all bony prominences padded.     The operative lower extremity was prepped from the iliac  crest to the distal leg.  Sterile draping was performed.  Time out was performed prior to incision.      A routine posterolateral approach was utilized via sharp dissection  carried down through the subcutaneous tissue.  Gross bleeders were Bovie coagulated.  The iliotibial band was identified and incised along the length  of the skin incision.  Self-retaining retractors were  inserted.  With the hip internally rotated, the short external rotators  were identified. The piriformis and capsule was tagged with Ethibond, and the hip capsule released in a T-type fashion.  The femoral neck was exposed, and I resected the femoral neck using the appropriate jig. This was performed at approximately a thumb's breadth above the lesser trochanter.    I then exposed the deep acetabulum, cleared out any tissue including the ligamentum teres.  A wing retractor was placed.  After adequate visualization, I excised the labrum, and then sequentially reamed.  I placed the trial acetabulum, which seated nicely, and then impacted the real cup into place.  Appropriate version and inclination was confirmed clinically matching their bony anatomy, and also with the use of the jig.  I placed a cancellous screw to augment fixation.  A trial polyethylene liner was placed and the wing retractor removed.    I then prepared the proximal femur using the cookie-cutter, the lateralizing reamer, and then sequentially reamed and broached.  A trial broach, neck, and head was utilized, and I reduced the hip and it was found to have excellent stability with functional range of motion. The trial components were then removed, and the real polyethylene liner was placed.  I then impacted the real femoral prosthesis into place into the appropriate version, slightly anteverted to the normal anatomy, and I impacted the real head ball into place. The hip was then reduced and taken through functional range of motion and found to have excellent stability. Leg lengths were restored.  Initially the +5 felt a little too short, so I switched to the 8.5.    I then used a 2 mm drill bits to pass the Ethibond suture from the capsule and piriformis through the greater trochanter, and secured this. Excellent posterior capsular repair was achieved. I also closed the T in the  capsule.  I then irrigated the hip copiously again, and repaired the fascia with Stratafix, followed by Vicryl for the subcutaneous tissue, Monocryl for the skin, Steri-Strips and sterile gauze. The wounds were injected. The patient was then awakened and returned to PACU in stable and satisfactory condition. There were no complications.  Osa Blase, MD Orthopedic Surgeon [phone]   09/23/2023 12:05 PM

## 2023-09-23 NOTE — Transfer of Care (Signed)
 Immediate Anesthesia Transfer of Care Note  Patient: Tammy Sutton  Procedure(s) Performed: ARTHROPLASTY, HIP, TOTAL,POSTERIOR APPROACH (Left: Hip)  Patient Location: PACU  Anesthesia Type:MAC combined with regional for post-op pain  Level of Consciousness: awake and alert   Airway & Oxygen Therapy: Patient Spontanous Breathing and Patient connected to nasal cannula oxygen  Post-op Assessment: Report given to RN and Post -op Vital signs reviewed and stable  Post vital signs: Reviewed and stable  Last Vitals:  Vitals Value Taken Time  BP 155/89 09/23/23 1246  Temp    Pulse 64 09/23/23 1248  Resp 17 09/23/23 1249  SpO2 100 % 09/23/23 1248  Vitals shown include unfiled device data.  Last Pain:  Vitals:   09/23/23 0740  TempSrc:   PainSc: 0-No pain         Complications: No notable events documented.

## 2023-09-23 NOTE — Anesthesia Procedure Notes (Signed)
 Spinal  Patient location during procedure: OR Start time: 09/23/2023 10:22 AM End time: 09/23/2023 10:28 AM Reason for block: surgical anesthesia Staffing Performed: anesthesiologist  Anesthesiologist: Peggi Bowels, MD Performed by: Peggi Bowels, MD Authorized by: Peggi Bowels, MD   Preanesthetic Checklist Completed: patient identified, IV checked, risks and benefits discussed, surgical consent, monitors and equipment checked, pre-op evaluation and timeout performed Spinal Block Patient position: sitting Prep: DuraPrep Patient monitoring: cardiac monitor, continuous pulse ox and blood pressure Approach: left paramedian Location: L3-4 Injection technique: single-shot Needle Needle type: Pencan  Needle gauge: 24 G Needle length: 9 cm Assessment Sensory level: T10 Events: CSF return Additional Notes Functioning IV was confirmed and monitors were applied. Sterile prep and drape, including hand hygiene and sterile gloves were used. The patient was positioned and the spine was prepped. The skin was anesthetized with lidocaine .  Free flow of clear CSF was obtained prior to injecting local anesthetic into the CSF.  The spinal needle aspirated freely following injection.  The needle was carefully withdrawn.  The patient tolerated the procedure well.

## 2023-09-23 NOTE — Anesthesia Postprocedure Evaluation (Signed)
 Anesthesia Post Note  Patient: Tammy Sutton  Procedure(s) Performed: ARTHROPLASTY, HIP, TOTAL,POSTERIOR APPROACH (Left: Hip)     Patient location during evaluation: PACU Anesthesia Type: Spinal Level of consciousness: awake Pain management: pain level controlled Vital Signs Assessment: post-procedure vital signs reviewed and stable Respiratory status: spontaneous breathing, nonlabored ventilation and respiratory function stable Cardiovascular status: blood pressure returned to baseline and stable Postop Assessment: no apparent nausea or vomiting Anesthetic complications: no   No notable events documented.  Last Vitals:  Vitals:   09/23/23 1445 09/23/23 1500  BP: (!) 160/91 (!) 158/89  Pulse: 85 80  Resp: (!) 21 19  Temp:  36.5 C  SpO2: 97% 96%    Last Pain:  Vitals:   09/23/23 1500  TempSrc:   PainSc: 0-No pain                 Marquett Bertoli P Greogry Goodwyn

## 2023-09-23 NOTE — Interval H&P Note (Signed)
 History and Physical Interval Note:  09/23/2023 7:29 AM  Tammy Sutton  has presented today for surgery, with the diagnosis of LEFT HIP OA.  The various methods of treatment have been discussed with the patient and family. After consideration of risks, benefits and other options for treatment, the patient has consented to  Procedure(s): ARTHROPLASTY, HIP, TOTAL,POSTERIOR APPROACH (Left) as a surgical intervention.  The patient's history has been reviewed, patient examined, no change in status, stable for surgery.  I have reviewed the patient's chart and labs.  Questions were answered to the patient's satisfaction.     Neville Barbone

## 2023-09-23 NOTE — Evaluation (Signed)
 Physical Therapy Evaluation Patient Details Name: Tammy Sutton MRN: [id number] DOB: 09-06-1945 Today's Date: 09/23/2023  History of Present Illness  Pt is 78 yo female admitted on 09/23/23 for L posterior THA.  Pt with hx including but not limited to arthritis, asthma, CKD, HTN, R TKA, R rTSA  Clinical Impression  Pt is s/p THA resulting in the deficits listed below (see PT Problem List). PT with orders to see pt for possible SDDC.  At baseline, pt lives alone and is independent.  She has arranged for someone to assist at d/c and does have DME.  She does live in split level home with stairs to manage.  Today, pt with no pain.  She had good understanding of safety and HEP with outpt PT scheduled later this week. Pt able to ambulate 100' with RW and CGA and performed stairs similar to home set up. Pt demonstrates safe gait & transfers in order to return home from PT perspective once discharged by MD.  While in hospital, will continue to benefit from PT for skilled therapy to advance mobility and exercises.            If plan is discharge home, recommend the following: A little help with walking and/or transfers;A little help with bathing/dressing/bathroom;Assistance with cooking/housework;Help with stairs or ramp for entrance   Can travel by private vehicle        Equipment Recommendations None recommended by PT  Recommendations for Other Services       Functional Status Assessment Patient has had a recent decline in their functional status and demonstrates the ability to make significant improvements in function in a reasonable and predictable amount of time.     Precautions / Restrictions Precautions Precautions: Fall Precaution/Restrictions Comments: NO posterior precautions Restrictions Weight Bearing Restrictions Per Provider Order: Yes LLE Weight Bearing Per Provider Order: Weight bearing as tolerated      Mobility  Bed Mobility Overal bed mobility: Needs Assistance Bed  Mobility: Supine to Sit, Sit to Supine     Supine to sit: Contact guard Sit to supine: Supervision        Transfers Overall transfer level: Needs assistance Equipment used: Rolling walker (2 wheels) Transfers: Sit to/from Stand Sit to Stand: Contact guard assist           General transfer comment: performed safetly from bed and toilet    Ambulation/Gait Ambulation/Gait assistance: Contact guard assist Gait Distance (Feet): 100 Feet Assistive device: Rolling walker (2 wheels) Gait Pattern/deviations: Step-to pattern, Decreased stride length, Decreased weight shift to left Gait velocity: decreased     General Gait Details: steady gait with RW; min cues for pushing RW and proximity  Stairs Stairs: Yes Stairs assistance: Contact guard assist Stair Management: One rail Right, Step to pattern, Forwards Number of Stairs: 5 General stair comments: pt recalling correct sequencing ; performed safely  Wheelchair Mobility     Tilt Bed    Modified Rankin (Stroke Patients Only)       Balance Overall balance assessment: Needs assistance Sitting-balance support: No upper extremity supported Sitting balance-Leahy Scale: Good     Standing balance support: Bilateral upper extremity supported, No upper extremity supported Standing balance-Leahy Scale: Fair Standing balance comment: RW to ambulate but could static stand without UE support                             Pertinent Vitals/Pain Pain Assessment Pain Assessment: No/denies pain  Home Living Family/patient expects to be discharged to:: Private residence Living Arrangements: Alone Available Help at Discharge: Friend(s);Available 24 hours/day Type of Home: House Home Access: Stairs to enter   [employer] of Steps: 1 step outside then 4-5 steps from entry way to main level with rail on R Alternate Level Stairs-Number of Steps: Split level home: 4-5 steps up to main area where she will be  staying in recliner.  The half bath is on the entry level.  The full bath and bedroom are on top level - no rail rail here. Home Layout: Multi-level;1/2 bath on main level Home Equipment: Shower seat;BSC/3in1;Toilet riser;Rolling Walker (2 wheels);Cane - single point      Prior Function Prior Level of Function : Independent/Modified Independent;Driving             Mobility Comments: Independent with ambulation; no AD need; went to exercise class 6 days a week ADLs Comments: Independent with ADLs, driving, cooking, and cleaning     Extremity/Trunk Assessment   Upper Extremity Assessment Upper Extremity Assessment: Overall WFL for tasks assessed    Lower Extremity Assessment Lower Extremity Assessment: LLE deficits/detail;RLE deficits/detail RLE Deficits / Details: ROM WFL; MMT 5/5 RLE Sensation: WNL LLE Deficits / Details: Expected post op changes; ROM WFL; MMT: ankle 5/5, knee ext 3/5, hip grossly 2/5 LLE Sensation: WNL    Cervical / Trunk Assessment Cervical / Trunk Assessment: Normal  Communication        Cognition Arousal: Alert Behavior During Therapy: WFL for tasks assessed/performed   PT - Cognitive impairments: No apparent impairments                                 Cueing       General Comments General comments (skin integrity, edema, etc.): VSS  Educated on safe ice use, no pivots, car transfers, and TED hose during day. Also, encouraged walking every 1-2 hours during day for 5-10 mins. Educated on HEP with focus on mobility the first week. Discussed doing exercises within pain control and if pain increasing could decreased ROM, reps, and stop exercises as needed. Encouraged to perform ankle pumps frequently for blood flow.   Exercises Total Joint Exercises Ankle Circles/Pumps: AROM, Both, 10 reps, Supine Quad Sets: AROM, Both, 10 reps, Supine Heel Slides: AAROM, Left, 5 reps, Supine Hip ABduction/ADduction: AAROM, Left, 5 reps, Supine Long  Arc Quad: AROM, Left, 5 reps, Seated   Assessment/Plan    PT Assessment Patient needs continued PT services  PT Problem List Decreased strength;Pain;Decreased range of motion;Decreased activity tolerance;Decreased balance;Decreased mobility;Decreased knowledge of use of DME       PT Treatment Interventions DME instruction;Therapeutic exercise;Gait training;Stair training;Functional mobility training;Therapeutic activities;Patient/family education;Modalities;Balance training    PT Goals (Current goals can be found in the Care Plan section)  Acute Rehab PT Goals Patient Stated Goal: return home and be able to get back to exercise classes and working in garden PT Goal Formulation: With patient/family Time For Goal Achievement: 10/07/23 Potential to Achieve Goals: Good    Frequency 7X/week     Co-evaluation               AM-PAC PT "6 Clicks" Mobility  Outcome Measure Help needed turning from your back to your side while in a flat bed without using bedrails?: A Little Help needed moving from lying on your back to sitting on the side of a flat bed without using bedrails?: A  Little Help needed moving to and from a bed to a chair (including a wheelchair)?: A Little Help needed standing up from a chair using your arms (e.g., wheelchair or bedside chair)?: A Little Help needed to walk in hospital room?: A Little Help needed climbing 3-5 steps with a railing? : A Little 6 Click Score: 18    End of Session Equipment Utilized During Treatment: Gait belt Activity Tolerance: Patient tolerated treatment well Patient left: in bed;with family/visitor present (PACU) Nurse Communication: Mobility status (passed therapy , able to urinate) PT Visit Diagnosis: Other abnormalities of gait and mobility (R26.89);Muscle weakness (generalized) (M62.81)    Time: 1650-1730 PT Time Calculation (min) (ACUTE ONLY): 40 min   Charges:   PT Evaluation $PT Eval Low Complexity: 1 Low PT  Treatments $Gait Training: 8-22 mins $Therapeutic Activity: 8-22 mins PT General Charges $$ ACUTE PT VISIT: 1 Visit         Cyd Dowse, PT Acute Rehab [facility] [phone]   Carolynn Citrin 09/23/2023, 5:58 PM

## 2023-09-24 ENCOUNTER — Encounter (HOSPITAL_COMMUNITY): Payer: Self-pay | Admitting: Orthopedic Surgery

## 2023-09-25 DIAGNOSIS — M25552 Pain in left hip: Secondary | ICD-10-CM | POA: Diagnosis not present

## 2023-09-25 DIAGNOSIS — M6281 Muscle weakness (generalized): Secondary | ICD-10-CM | POA: Diagnosis not present

## 2023-09-25 DIAGNOSIS — Z471 Aftercare following joint replacement surgery: Secondary | ICD-10-CM | POA: Diagnosis not present

## 2023-09-29 DIAGNOSIS — M25552 Pain in left hip: Secondary | ICD-10-CM | POA: Diagnosis not present

## 2023-09-29 DIAGNOSIS — Z471 Aftercare following joint replacement surgery: Secondary | ICD-10-CM | POA: Diagnosis not present

## 2023-09-29 DIAGNOSIS — M6281 Muscle weakness (generalized): Secondary | ICD-10-CM | POA: Diagnosis not present

## 2023-10-01 DIAGNOSIS — M25552 Pain in left hip: Secondary | ICD-10-CM | POA: Diagnosis not present

## 2023-10-01 DIAGNOSIS — Z471 Aftercare following joint replacement surgery: Secondary | ICD-10-CM | POA: Diagnosis not present

## 2023-10-01 DIAGNOSIS — M6281 Muscle weakness (generalized): Secondary | ICD-10-CM | POA: Diagnosis not present

## 2023-10-07 DIAGNOSIS — M25552 Pain in left hip: Secondary | ICD-10-CM | POA: Diagnosis not present

## 2023-10-07 DIAGNOSIS — Z471 Aftercare following joint replacement surgery: Secondary | ICD-10-CM | POA: Diagnosis not present

## 2023-10-07 DIAGNOSIS — M6281 Muscle weakness (generalized): Secondary | ICD-10-CM | POA: Diagnosis not present

## 2023-10-08 DIAGNOSIS — M1612 Unilateral primary osteoarthritis, left hip: Secondary | ICD-10-CM | POA: Diagnosis not present

## 2023-10-09 DIAGNOSIS — Z471 Aftercare following joint replacement surgery: Secondary | ICD-10-CM | POA: Diagnosis not present

## 2023-10-09 DIAGNOSIS — M25552 Pain in left hip: Secondary | ICD-10-CM | POA: Diagnosis not present

## 2023-10-09 DIAGNOSIS — M6281 Muscle weakness (generalized): Secondary | ICD-10-CM | POA: Diagnosis not present

## 2023-10-13 DIAGNOSIS — Z471 Aftercare following joint replacement surgery: Secondary | ICD-10-CM | POA: Diagnosis not present

## 2023-10-13 DIAGNOSIS — M6281 Muscle weakness (generalized): Secondary | ICD-10-CM | POA: Diagnosis not present

## 2023-10-13 DIAGNOSIS — M25552 Pain in left hip: Secondary | ICD-10-CM | POA: Diagnosis not present

## 2023-10-16 DIAGNOSIS — Z471 Aftercare following joint replacement surgery: Secondary | ICD-10-CM | POA: Diagnosis not present

## 2023-10-16 DIAGNOSIS — M25552 Pain in left hip: Secondary | ICD-10-CM | POA: Diagnosis not present

## 2023-10-16 DIAGNOSIS — M6281 Muscle weakness (generalized): Secondary | ICD-10-CM | POA: Diagnosis not present

## 2023-10-21 DIAGNOSIS — Z681 Body mass index (BMI) 19 or less, adult: Secondary | ICD-10-CM | POA: Diagnosis not present

## 2023-10-21 DIAGNOSIS — M4727 Other spondylosis with radiculopathy, lumbosacral region: Secondary | ICD-10-CM | POA: Diagnosis not present

## 2023-10-21 DIAGNOSIS — M1991 Primary osteoarthritis, unspecified site: Secondary | ICD-10-CM | POA: Diagnosis not present

## 2023-10-21 DIAGNOSIS — M3501 Sicca syndrome with keratoconjunctivitis: Secondary | ICD-10-CM | POA: Diagnosis not present

## 2023-10-21 DIAGNOSIS — R748 Abnormal levels of other serum enzymes: Secondary | ICD-10-CM | POA: Diagnosis not present

## 2023-10-22 DIAGNOSIS — Z471 Aftercare following joint replacement surgery: Secondary | ICD-10-CM | POA: Diagnosis not present

## 2023-10-22 DIAGNOSIS — M6281 Muscle weakness (generalized): Secondary | ICD-10-CM | POA: Diagnosis not present

## 2023-10-22 DIAGNOSIS — M25552 Pain in left hip: Secondary | ICD-10-CM | POA: Diagnosis not present

## 2023-10-24 DIAGNOSIS — M25552 Pain in left hip: Secondary | ICD-10-CM | POA: Diagnosis not present

## 2023-10-24 DIAGNOSIS — Z471 Aftercare following joint replacement surgery: Secondary | ICD-10-CM | POA: Diagnosis not present

## 2023-10-24 DIAGNOSIS — M6281 Muscle weakness (generalized): Secondary | ICD-10-CM | POA: Diagnosis not present

## 2023-10-29 DIAGNOSIS — I10 Essential (primary) hypertension: Secondary | ICD-10-CM | POA: Diagnosis not present

## 2023-10-29 DIAGNOSIS — E7800 Pure hypercholesterolemia, unspecified: Secondary | ICD-10-CM | POA: Diagnosis not present

## 2023-10-29 DIAGNOSIS — Z131 Encounter for screening for diabetes mellitus: Secondary | ICD-10-CM | POA: Diagnosis not present

## 2023-10-29 DIAGNOSIS — D649 Anemia, unspecified: Secondary | ICD-10-CM | POA: Diagnosis not present

## 2023-10-29 DIAGNOSIS — R7989 Other specified abnormal findings of blood chemistry: Secondary | ICD-10-CM | POA: Diagnosis not present

## 2023-10-29 DIAGNOSIS — Z1329 Encounter for screening for other suspected endocrine disorder: Secondary | ICD-10-CM | POA: Diagnosis not present

## 2023-11-05 DIAGNOSIS — M3505 Sjogren syndrome with inflammatory arthritis: Secondary | ICD-10-CM | POA: Diagnosis not present

## 2023-11-05 DIAGNOSIS — D649 Anemia, unspecified: Secondary | ICD-10-CM | POA: Diagnosis not present

## 2023-11-05 DIAGNOSIS — I10 Essential (primary) hypertension: Secondary | ICD-10-CM | POA: Diagnosis not present

## 2023-11-05 DIAGNOSIS — Z6820 Body mass index (BMI) 20.0-20.9, adult: Secondary | ICD-10-CM | POA: Diagnosis not present

## 2023-11-05 DIAGNOSIS — E7800 Pure hypercholesterolemia, unspecified: Secondary | ICD-10-CM | POA: Diagnosis not present

## 2023-12-10 DIAGNOSIS — M9901 Segmental and somatic dysfunction of cervical region: Secondary | ICD-10-CM | POA: Diagnosis not present

## 2023-12-10 DIAGNOSIS — M9903 Segmental and somatic dysfunction of lumbar region: Secondary | ICD-10-CM | POA: Diagnosis not present

## 2023-12-10 DIAGNOSIS — M9904 Segmental and somatic dysfunction of sacral region: Secondary | ICD-10-CM | POA: Diagnosis not present

## 2023-12-10 DIAGNOSIS — S335XXA Sprain of ligaments of lumbar spine, initial encounter: Secondary | ICD-10-CM | POA: Diagnosis not present

## 2023-12-24 DIAGNOSIS — S335XXA Sprain of ligaments of lumbar spine, initial encounter: Secondary | ICD-10-CM | POA: Diagnosis not present

## 2023-12-24 DIAGNOSIS — M9901 Segmental and somatic dysfunction of cervical region: Secondary | ICD-10-CM | POA: Diagnosis not present

## 2023-12-24 DIAGNOSIS — M9903 Segmental and somatic dysfunction of lumbar region: Secondary | ICD-10-CM | POA: Diagnosis not present

## 2023-12-24 DIAGNOSIS — M9904 Segmental and somatic dysfunction of sacral region: Secondary | ICD-10-CM | POA: Diagnosis not present

## 2024-01-07 DIAGNOSIS — M9904 Segmental and somatic dysfunction of sacral region: Secondary | ICD-10-CM | POA: Diagnosis not present

## 2024-01-07 DIAGNOSIS — M9903 Segmental and somatic dysfunction of lumbar region: Secondary | ICD-10-CM | POA: Diagnosis not present

## 2024-01-07 DIAGNOSIS — M9901 Segmental and somatic dysfunction of cervical region: Secondary | ICD-10-CM | POA: Diagnosis not present

## 2024-01-07 DIAGNOSIS — S335XXA Sprain of ligaments of lumbar spine, initial encounter: Secondary | ICD-10-CM | POA: Diagnosis not present

## 2024-01-21 DIAGNOSIS — M9901 Segmental and somatic dysfunction of cervical region: Secondary | ICD-10-CM | POA: Diagnosis not present

## 2024-01-21 DIAGNOSIS — M9904 Segmental and somatic dysfunction of sacral region: Secondary | ICD-10-CM | POA: Diagnosis not present

## 2024-01-21 DIAGNOSIS — M9903 Segmental and somatic dysfunction of lumbar region: Secondary | ICD-10-CM | POA: Diagnosis not present

## 2024-01-21 DIAGNOSIS — S335XXA Sprain of ligaments of lumbar spine, initial encounter: Secondary | ICD-10-CM | POA: Diagnosis not present

## 2024-01-29 DIAGNOSIS — R7303 Prediabetes: Secondary | ICD-10-CM | POA: Diagnosis not present

## 2024-01-29 DIAGNOSIS — Z13220 Encounter for screening for lipoid disorders: Secondary | ICD-10-CM | POA: Diagnosis not present

## 2024-01-29 DIAGNOSIS — N1832 Chronic kidney disease, stage 3b: Secondary | ICD-10-CM | POA: Diagnosis not present

## 2024-01-29 DIAGNOSIS — Z0001 Encounter for general adult medical examination with abnormal findings: Secondary | ICD-10-CM | POA: Diagnosis not present

## 2024-01-29 DIAGNOSIS — D649 Anemia, unspecified: Secondary | ICD-10-CM | POA: Diagnosis not present

## 2024-01-29 DIAGNOSIS — I10 Essential (primary) hypertension: Secondary | ICD-10-CM | POA: Diagnosis not present

## 2024-02-05 DIAGNOSIS — Z1389 Encounter for screening for other disorder: Secondary | ICD-10-CM | POA: Diagnosis not present

## 2024-02-05 DIAGNOSIS — Z0001 Encounter for general adult medical examination with abnormal findings: Secondary | ICD-10-CM | POA: Diagnosis not present

## 2024-02-05 DIAGNOSIS — Z1331 Encounter for screening for depression: Secondary | ICD-10-CM | POA: Diagnosis not present

## 2024-02-05 DIAGNOSIS — R7303 Prediabetes: Secondary | ICD-10-CM | POA: Diagnosis not present

## 2024-02-05 DIAGNOSIS — E7800 Pure hypercholesterolemia, unspecified: Secondary | ICD-10-CM | POA: Diagnosis not present

## 2024-02-05 DIAGNOSIS — Z23 Encounter for immunization: Secondary | ICD-10-CM | POA: Diagnosis not present

## 2024-02-05 DIAGNOSIS — Z0000 Encounter for general adult medical examination without abnormal findings: Secondary | ICD-10-CM | POA: Diagnosis not present

## 2024-02-05 DIAGNOSIS — Z6820 Body mass index (BMI) 20.0-20.9, adult: Secondary | ICD-10-CM | POA: Diagnosis not present

## 2024-02-05 DIAGNOSIS — M3505 Sjogren syndrome with inflammatory arthritis: Secondary | ICD-10-CM | POA: Diagnosis not present

## 2024-02-05 DIAGNOSIS — I10 Essential (primary) hypertension: Secondary | ICD-10-CM | POA: Diagnosis not present

## 2024-02-09 DIAGNOSIS — M1612 Unilateral primary osteoarthritis, left hip: Secondary | ICD-10-CM | POA: Diagnosis not present

## 2024-02-09 DIAGNOSIS — M1611 Unilateral primary osteoarthritis, right hip: Secondary | ICD-10-CM | POA: Diagnosis not present

## 2024-02-09 DIAGNOSIS — M4120 Other idiopathic scoliosis, site unspecified: Secondary | ICD-10-CM | POA: Diagnosis not present

## 2024-02-11 DIAGNOSIS — H04123 Dry eye syndrome of bilateral lacrimal glands: Secondary | ICD-10-CM | POA: Diagnosis not present

## 2024-02-12 DIAGNOSIS — S335XXA Sprain of ligaments of lumbar spine, initial encounter: Secondary | ICD-10-CM | POA: Diagnosis not present

## 2024-02-12 DIAGNOSIS — M9901 Segmental and somatic dysfunction of cervical region: Secondary | ICD-10-CM | POA: Diagnosis not present

## 2024-02-12 DIAGNOSIS — M9904 Segmental and somatic dysfunction of sacral region: Secondary | ICD-10-CM | POA: Diagnosis not present

## 2024-02-12 DIAGNOSIS — M9903 Segmental and somatic dysfunction of lumbar region: Secondary | ICD-10-CM | POA: Diagnosis not present

## 2024-02-19 DIAGNOSIS — N1832 Chronic kidney disease, stage 3b: Secondary | ICD-10-CM | POA: Diagnosis not present

## 2024-02-19 DIAGNOSIS — D649 Anemia, unspecified: Secondary | ICD-10-CM | POA: Diagnosis not present

## 2024-02-26 DIAGNOSIS — M4120 Other idiopathic scoliosis, site unspecified: Secondary | ICD-10-CM | POA: Diagnosis not present

## 2024-02-26 DIAGNOSIS — M48061 Spinal stenosis, lumbar region without neurogenic claudication: Secondary | ICD-10-CM | POA: Diagnosis not present

## 2024-03-01 DIAGNOSIS — M545 Low back pain, unspecified: Secondary | ICD-10-CM | POA: Diagnosis not present

## 2024-03-01 DIAGNOSIS — M6281 Muscle weakness (generalized): Secondary | ICD-10-CM | POA: Diagnosis not present

## 2024-03-04 DIAGNOSIS — S335XXA Sprain of ligaments of lumbar spine, initial encounter: Secondary | ICD-10-CM | POA: Diagnosis not present

## 2024-03-04 DIAGNOSIS — M545 Low back pain, unspecified: Secondary | ICD-10-CM | POA: Diagnosis not present

## 2024-03-04 DIAGNOSIS — M9903 Segmental and somatic dysfunction of lumbar region: Secondary | ICD-10-CM | POA: Diagnosis not present

## 2024-03-04 DIAGNOSIS — M9901 Segmental and somatic dysfunction of cervical region: Secondary | ICD-10-CM | POA: Diagnosis not present

## 2024-03-04 DIAGNOSIS — M9904 Segmental and somatic dysfunction of sacral region: Secondary | ICD-10-CM | POA: Diagnosis not present

## 2024-03-04 DIAGNOSIS — M6281 Muscle weakness (generalized): Secondary | ICD-10-CM | POA: Diagnosis not present

## 2024-03-04 NOTE — Progress Notes (Signed)
 Tammy Sutton                                          MRN: [id number]   03/04/2024   The [facility] Quality Team Specialist reviewed this patient medical record for the purposes of chart review for care gap closure. The following were reviewed: chart review for care gap closure-controlling blood pressure.    [facility] Quality Team

## 2024-03-08 DIAGNOSIS — M545 Low back pain, unspecified: Secondary | ICD-10-CM | POA: Diagnosis not present

## 2024-03-08 DIAGNOSIS — M6281 Muscle weakness (generalized): Secondary | ICD-10-CM | POA: Diagnosis not present

## 2024-03-10 DIAGNOSIS — M6281 Muscle weakness (generalized): Secondary | ICD-10-CM | POA: Diagnosis not present

## 2024-03-10 DIAGNOSIS — M545 Low back pain, unspecified: Secondary | ICD-10-CM | POA: Diagnosis not present

## 2024-03-12 DIAGNOSIS — M5416 Radiculopathy, lumbar region: Secondary | ICD-10-CM | POA: Diagnosis not present

## 2024-03-16 DIAGNOSIS — M6281 Muscle weakness (generalized): Secondary | ICD-10-CM | POA: Diagnosis not present

## 2024-03-16 DIAGNOSIS — M545 Low back pain, unspecified: Secondary | ICD-10-CM | POA: Diagnosis not present

## 2024-03-19 DIAGNOSIS — M6281 Muscle weakness (generalized): Secondary | ICD-10-CM | POA: Diagnosis not present

## 2024-03-19 DIAGNOSIS — M545 Low back pain, unspecified: Secondary | ICD-10-CM | POA: Diagnosis not present

## 2024-03-22 DIAGNOSIS — M545 Low back pain, unspecified: Secondary | ICD-10-CM | POA: Diagnosis not present

## 2024-03-22 DIAGNOSIS — M6281 Muscle weakness (generalized): Secondary | ICD-10-CM | POA: Diagnosis not present

## 2024-03-24 DIAGNOSIS — S335XXA Sprain of ligaments of lumbar spine, initial encounter: Secondary | ICD-10-CM | POA: Diagnosis not present

## 2024-03-24 DIAGNOSIS — M9904 Segmental and somatic dysfunction of sacral region: Secondary | ICD-10-CM | POA: Diagnosis not present

## 2024-03-24 DIAGNOSIS — M9901 Segmental and somatic dysfunction of cervical region: Secondary | ICD-10-CM | POA: Diagnosis not present

## 2024-03-24 DIAGNOSIS — M9903 Segmental and somatic dysfunction of lumbar region: Secondary | ICD-10-CM | POA: Diagnosis not present

## 2024-03-25 DIAGNOSIS — M48061 Spinal stenosis, lumbar region without neurogenic claudication: Secondary | ICD-10-CM | POA: Diagnosis not present

## 2024-03-26 DIAGNOSIS — M545 Low back pain, unspecified: Secondary | ICD-10-CM | POA: Diagnosis not present

## 2024-03-26 DIAGNOSIS — M6281 Muscle weakness (generalized): Secondary | ICD-10-CM | POA: Diagnosis not present

## 2024-04-01 DIAGNOSIS — M48062 Spinal stenosis, lumbar region with neurogenic claudication: Secondary | ICD-10-CM | POA: Diagnosis not present

## 2024-04-01 DIAGNOSIS — M419 Scoliosis, unspecified: Secondary | ICD-10-CM | POA: Diagnosis not present

## 2024-04-02 ENCOUNTER — Other Ambulatory Visit (HOSPITAL_COMMUNITY): Payer: Self-pay

## 2024-04-02 DIAGNOSIS — M5459 Other low back pain: Secondary | ICD-10-CM

## 2024-04-06 ENCOUNTER — Other Ambulatory Visit (HOSPITAL_COMMUNITY): Payer: Self-pay

## 2024-04-06 ENCOUNTER — Ambulatory Visit (HOSPITAL_COMMUNITY)
Admission: RE | Admit: 2024-04-06 | Discharge: 2024-04-06 | Disposition: A | Discharge status: Home / Self Care | Source: Ambulatory Visit

## 2024-04-06 DIAGNOSIS — M545 Low back pain, unspecified: Secondary | ICD-10-CM

## 2024-04-06 DIAGNOSIS — M5459 Other low back pain: Secondary | ICD-10-CM | POA: Insufficient documentation

## 2024-04-06 DIAGNOSIS — M48061 Spinal stenosis, lumbar region without neurogenic claudication: Secondary | ICD-10-CM | POA: Diagnosis not present

## 2024-04-06 DIAGNOSIS — M47816 Spondylosis without myelopathy or radiculopathy, lumbar region: Secondary | ICD-10-CM | POA: Diagnosis not present

## 2024-04-06 DIAGNOSIS — M5136 Other intervertebral disc degeneration, lumbar region with discogenic back pain only: Secondary | ICD-10-CM | POA: Diagnosis not present

## 2024-04-06 DIAGNOSIS — M4316 Spondylolisthesis, lumbar region: Secondary | ICD-10-CM | POA: Diagnosis not present

## 2024-04-11 ENCOUNTER — Ambulatory Visit (HOSPITAL_COMMUNITY)
Admission: RE | Admit: 2024-04-11 | Discharge: 2024-04-11 | Disposition: A | Discharge status: Home / Self Care | Source: Ambulatory Visit

## 2024-04-11 DIAGNOSIS — M5459 Other low back pain: Secondary | ICD-10-CM | POA: Diagnosis not present

## 2024-04-11 DIAGNOSIS — M545 Low back pain, unspecified: Secondary | ICD-10-CM | POA: Diagnosis not present

## 2024-04-11 DIAGNOSIS — M5136 Other intervertebral disc degeneration, lumbar region with discogenic back pain only: Secondary | ICD-10-CM | POA: Diagnosis not present

## 2024-04-11 DIAGNOSIS — M48061 Spinal stenosis, lumbar region without neurogenic claudication: Secondary | ICD-10-CM | POA: Diagnosis not present

## 2024-04-11 DIAGNOSIS — M47816 Spondylosis without myelopathy or radiculopathy, lumbar region: Secondary | ICD-10-CM | POA: Diagnosis not present

## 2024-04-11 DIAGNOSIS — M4316 Spondylolisthesis, lumbar region: Secondary | ICD-10-CM | POA: Diagnosis not present

## 2024-04-14 DIAGNOSIS — M9901 Segmental and somatic dysfunction of cervical region: Secondary | ICD-10-CM | POA: Diagnosis not present

## 2024-04-14 DIAGNOSIS — M9903 Segmental and somatic dysfunction of lumbar region: Secondary | ICD-10-CM | POA: Diagnosis not present

## 2024-04-14 DIAGNOSIS — S335XXA Sprain of ligaments of lumbar spine, initial encounter: Secondary | ICD-10-CM | POA: Diagnosis not present

## 2024-04-14 DIAGNOSIS — M9904 Segmental and somatic dysfunction of sacral region: Secondary | ICD-10-CM | POA: Diagnosis not present

## 2024-04-20 DIAGNOSIS — M48062 Spinal stenosis, lumbar region with neurogenic claudication: Secondary | ICD-10-CM | POA: Diagnosis not present

## 2024-04-20 DIAGNOSIS — B028 Zoster with other complications: Secondary | ICD-10-CM | POA: Diagnosis not present

## 2024-04-20 DIAGNOSIS — Z6820 Body mass index (BMI) 20.0-20.9, adult: Secondary | ICD-10-CM | POA: Diagnosis not present

## 2024-04-21 DIAGNOSIS — R7303 Prediabetes: Secondary | ICD-10-CM | POA: Diagnosis not present

## 2024-04-21 DIAGNOSIS — Z131 Encounter for screening for diabetes mellitus: Secondary | ICD-10-CM | POA: Diagnosis not present

## 2024-04-21 DIAGNOSIS — Z13220 Encounter for screening for lipoid disorders: Secondary | ICD-10-CM | POA: Diagnosis not present

## 2024-04-21 DIAGNOSIS — N1832 Chronic kidney disease, stage 3b: Secondary | ICD-10-CM | POA: Diagnosis not present

## 2024-04-21 DIAGNOSIS — Z1329 Encounter for screening for other suspected endocrine disorder: Secondary | ICD-10-CM | POA: Diagnosis not present

## 2024-04-21 DIAGNOSIS — E871 Hypo-osmolality and hyponatremia: Secondary | ICD-10-CM | POA: Diagnosis not present

## 2024-04-21 DIAGNOSIS — I10 Essential (primary) hypertension: Secondary | ICD-10-CM | POA: Diagnosis not present

## 2024-04-27 DIAGNOSIS — Z780 Asymptomatic menopausal state: Secondary | ICD-10-CM | POA: Diagnosis not present

## 2024-04-27 DIAGNOSIS — M810 Age-related osteoporosis without current pathological fracture: Secondary | ICD-10-CM | POA: Diagnosis not present

## 2024-04-28 DIAGNOSIS — Z01818 Encounter for other preprocedural examination: Secondary | ICD-10-CM | POA: Diagnosis not present

## 2024-04-28 DIAGNOSIS — M479 Spondylosis, unspecified: Secondary | ICD-10-CM | POA: Diagnosis not present

## 2024-04-28 DIAGNOSIS — Z6820 Body mass index (BMI) 20.0-20.9, adult: Secondary | ICD-10-CM | POA: Diagnosis not present

## 2024-04-28 DIAGNOSIS — E871 Hypo-osmolality and hyponatremia: Secondary | ICD-10-CM | POA: Diagnosis not present

## 2024-04-28 DIAGNOSIS — N1832 Chronic kidney disease, stage 3b: Secondary | ICD-10-CM | POA: Diagnosis not present

## 2024-04-28 DIAGNOSIS — M1991 Primary osteoarthritis, unspecified site: Secondary | ICD-10-CM | POA: Diagnosis not present

## 2024-04-28 DIAGNOSIS — M3505 Sjogren syndrome with inflammatory arthritis: Secondary | ICD-10-CM | POA: Diagnosis not present

## 2024-04-28 DIAGNOSIS — I10 Essential (primary) hypertension: Secondary | ICD-10-CM | POA: Diagnosis not present

## 2024-04-28 DIAGNOSIS — R7303 Prediabetes: Secondary | ICD-10-CM | POA: Diagnosis not present
